# Patient Record
Sex: Female | Born: 1945 | Race: White | Hispanic: No | Marital: Married | Smoking: Former smoker
Health system: Southern US, Community
[De-identification: ages and names within clinical notes are randomized; demographics above are authoritative.]

## PROBLEM LIST (undated history)

## (undated) DIAGNOSIS — J449 Chronic obstructive pulmonary disease, unspecified: Secondary | ICD-10-CM

## (undated) DIAGNOSIS — R0602 Shortness of breath: Secondary | ICD-10-CM

## (undated) DIAGNOSIS — I2510 Atherosclerotic heart disease of native coronary artery without angina pectoris: Secondary | ICD-10-CM

## (undated) DIAGNOSIS — J189 Pneumonia, unspecified organism: Secondary | ICD-10-CM

## (undated) DIAGNOSIS — Z87442 Personal history of urinary calculi: Secondary | ICD-10-CM

## (undated) DIAGNOSIS — I499 Cardiac arrhythmia, unspecified: Secondary | ICD-10-CM

## (undated) DIAGNOSIS — C801 Malignant (primary) neoplasm, unspecified: Secondary | ICD-10-CM

## (undated) DIAGNOSIS — Z9289 Personal history of other medical treatment: Secondary | ICD-10-CM

## (undated) HISTORY — PX: LUNG SURGERY: SHX703

## (undated) HISTORY — PX: ABDOMINAL HYSTERECTOMY: SHX81

## (undated) HISTORY — PX: KNEE ARTHROSCOPY: SHX127

## (undated) HISTORY — PX: CERVICAL SPINE SURGERY: SHX589

## (undated) HISTORY — PX: SPLENECTOMY, PARTIAL: SHX787

## (undated) HISTORY — PX: CARPAL TUNNEL RELEASE: SHX101

## (undated) HISTORY — PX: TEAR DUCT PROBING: SHX793

## (undated) HISTORY — PX: CARDIAC CATHETERIZATION: SHX172

## (undated) HISTORY — PX: ROTATOR CUFF REPAIR: SHX139

## (undated) HISTORY — PX: MOUTH SURGERY: SHX715

## (undated) HISTORY — PX: JOINT REPLACEMENT: SHX530

## (undated) HISTORY — PX: BLADDER SUSPENSION: SHX72

## (undated) HISTORY — PX: MASTECTOMY: SHX3

---

## 2003-10-29 HISTORY — PX: BREAST RECONSTRUCTION: SHX9

## 2004-05-31 ENCOUNTER — Other Ambulatory Visit: Payer: Self-pay

## 2004-08-13 ENCOUNTER — Ambulatory Visit: Payer: Self-pay | Admitting: Internal Medicine

## 2004-08-28 ENCOUNTER — Ambulatory Visit: Payer: Self-pay | Admitting: Internal Medicine

## 2004-09-27 ENCOUNTER — Ambulatory Visit: Payer: Self-pay | Admitting: Internal Medicine

## 2004-11-05 ENCOUNTER — Ambulatory Visit: Payer: Self-pay | Admitting: Internal Medicine

## 2004-11-28 ENCOUNTER — Ambulatory Visit: Payer: Self-pay | Admitting: Internal Medicine

## 2006-10-28 HISTORY — PX: BREAST SURGERY: SHX581

## 2007-12-04 ENCOUNTER — Other Ambulatory Visit: Payer: Self-pay

## 2007-12-04 ENCOUNTER — Inpatient Hospital Stay: Payer: Self-pay | Admitting: Internal Medicine

## 2007-12-27 ENCOUNTER — Ambulatory Visit: Payer: Self-pay | Admitting: Internal Medicine

## 2007-12-31 ENCOUNTER — Ambulatory Visit: Payer: Self-pay | Admitting: Rheumatology

## 2008-01-27 ENCOUNTER — Ambulatory Visit: Payer: Self-pay | Admitting: Internal Medicine

## 2008-02-02 ENCOUNTER — Ambulatory Visit: Payer: Self-pay | Admitting: Specialist

## 2008-02-09 ENCOUNTER — Other Ambulatory Visit: Payer: Self-pay

## 2008-02-09 ENCOUNTER — Inpatient Hospital Stay: Payer: Self-pay | Admitting: Internal Medicine

## 2008-02-10 ENCOUNTER — Ambulatory Visit: Payer: Self-pay | Admitting: Cardiology

## 2008-02-10 ENCOUNTER — Other Ambulatory Visit: Payer: Self-pay

## 2008-02-22 ENCOUNTER — Ambulatory Visit: Payer: Self-pay | Admitting: Internal Medicine

## 2008-02-26 ENCOUNTER — Ambulatory Visit: Payer: Self-pay | Admitting: Internal Medicine

## 2008-03-18 ENCOUNTER — Ambulatory Visit: Payer: Self-pay | Admitting: General Surgery

## 2008-03-28 ENCOUNTER — Ambulatory Visit: Payer: Self-pay | Admitting: Internal Medicine

## 2008-03-31 ENCOUNTER — Ambulatory Visit: Payer: Self-pay | Admitting: General Surgery

## 2008-04-15 ENCOUNTER — Ambulatory Visit: Payer: Self-pay | Admitting: Rheumatology

## 2008-04-27 ENCOUNTER — Ambulatory Visit: Payer: Self-pay | Admitting: Internal Medicine

## 2008-05-04 ENCOUNTER — Ambulatory Visit: Payer: Self-pay | Admitting: Internal Medicine

## 2008-05-28 ENCOUNTER — Ambulatory Visit: Payer: Self-pay | Admitting: Internal Medicine

## 2008-06-27 ENCOUNTER — Ambulatory Visit: Payer: Self-pay | Admitting: Internal Medicine

## 2008-07-05 ENCOUNTER — Ambulatory Visit: Payer: Self-pay | Admitting: Internal Medicine

## 2008-07-28 ENCOUNTER — Ambulatory Visit: Payer: Self-pay | Admitting: Internal Medicine

## 2008-08-28 ENCOUNTER — Ambulatory Visit: Payer: Self-pay | Admitting: Internal Medicine

## 2008-09-05 ENCOUNTER — Ambulatory Visit: Payer: Self-pay | Admitting: Internal Medicine

## 2008-09-27 ENCOUNTER — Ambulatory Visit: Payer: Self-pay | Admitting: Internal Medicine

## 2008-10-28 ENCOUNTER — Ambulatory Visit: Payer: Self-pay | Admitting: Internal Medicine

## 2008-11-07 ENCOUNTER — Ambulatory Visit: Payer: Self-pay | Admitting: Specialist

## 2008-11-28 ENCOUNTER — Ambulatory Visit: Payer: Self-pay | Admitting: Internal Medicine

## 2008-12-26 ENCOUNTER — Ambulatory Visit: Payer: Self-pay | Admitting: Internal Medicine

## 2009-01-26 ENCOUNTER — Ambulatory Visit: Payer: Self-pay | Admitting: Internal Medicine

## 2009-02-25 ENCOUNTER — Ambulatory Visit: Payer: Self-pay | Admitting: Internal Medicine

## 2009-03-13 ENCOUNTER — Ambulatory Visit: Payer: Self-pay | Admitting: Internal Medicine

## 2009-03-28 ENCOUNTER — Ambulatory Visit: Payer: Self-pay | Admitting: Internal Medicine

## 2009-04-27 ENCOUNTER — Ambulatory Visit: Payer: Self-pay | Admitting: Internal Medicine

## 2009-05-15 ENCOUNTER — Ambulatory Visit: Payer: Self-pay | Admitting: Internal Medicine

## 2009-05-28 ENCOUNTER — Ambulatory Visit: Payer: Self-pay | Admitting: Internal Medicine

## 2009-05-30 IMAGING — CT CT CHEST W/ CM
1 series · 16 of 33 positions shown, 20 images · IV contrast (agent unspecified)
Comparison: none

REASON FOR EXAM: chest density   bil opacity chest xray
COMMENTS:

PROCEDURE:     CT  - CT CHEST WITH CONTRAST  - February 02, 2008  [DATE]
RESULT:
HISTORY: Shortness of breath and pneumonia.
COMPARISON STUDIES:   Prior chest x-ray of 12/06/07.

[Series 2: soft tissue · axial · 0.75mm/px · z∈[+180,+465]mm · 16 of 63 slices shown, 20 images]
[im 3/63  mediastinal]
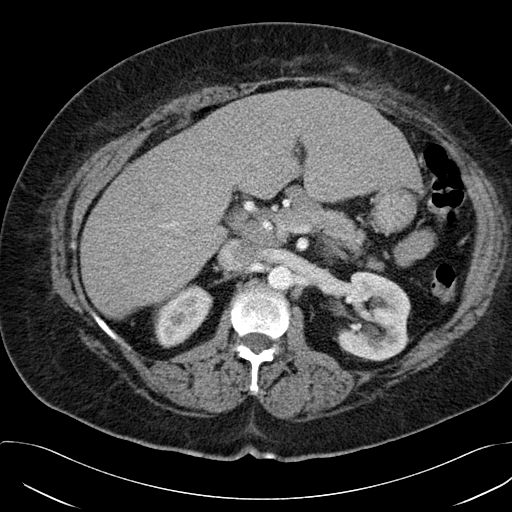
[im 3/63  lung]
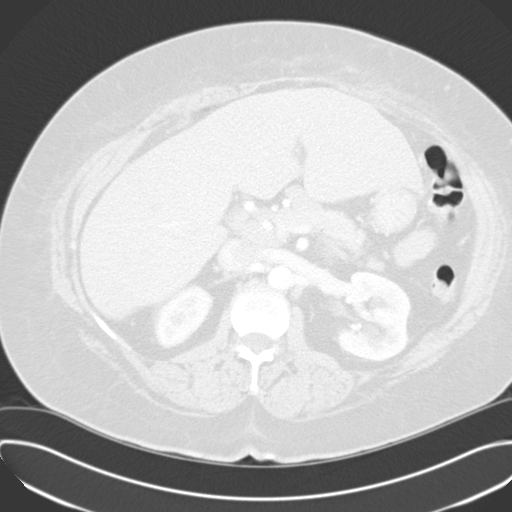
[im 7/63  lung]
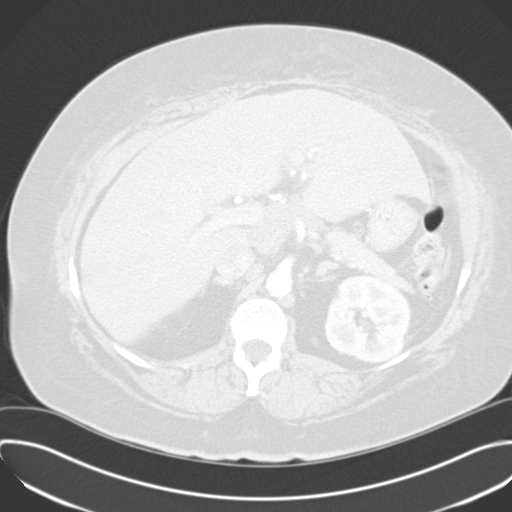
[im 12/63  lung]
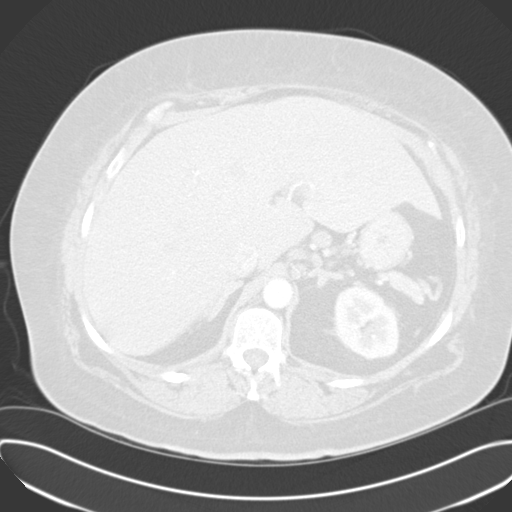
[im 14/63  lung]
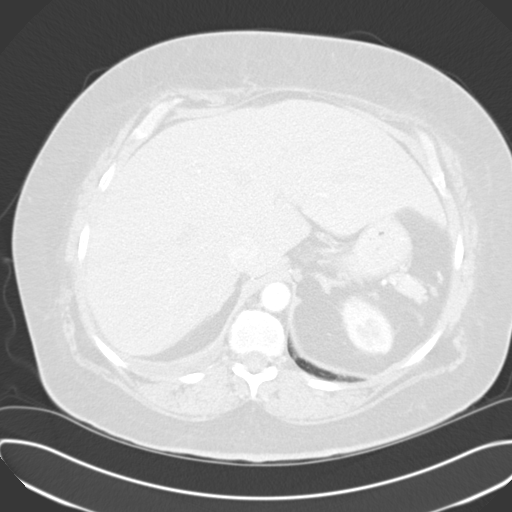
[im 19/63  mediastinal]
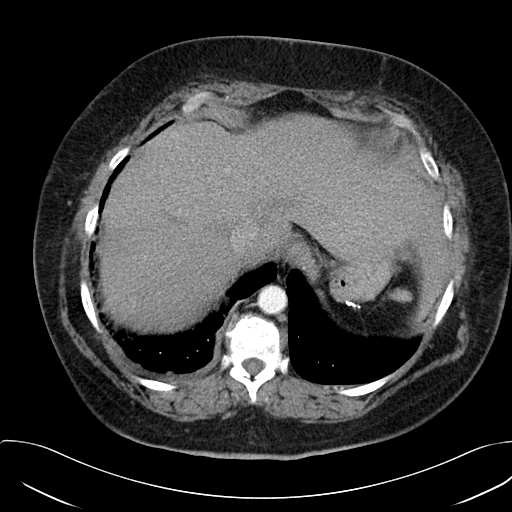
[im 19/63  lung]
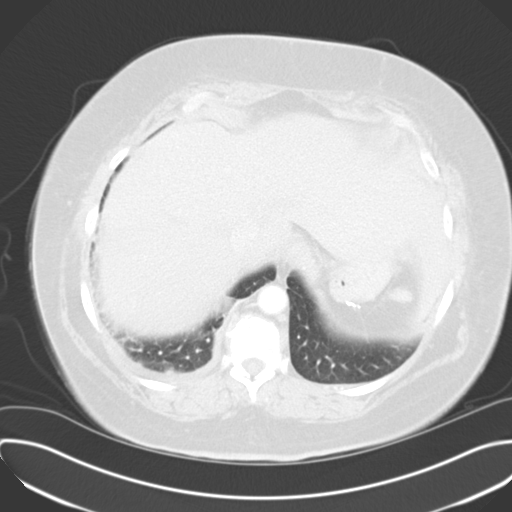
[im 23/63  lung]
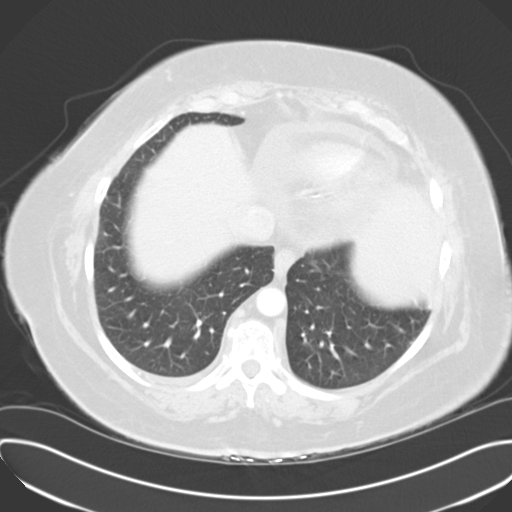
[im 26/63  lung]
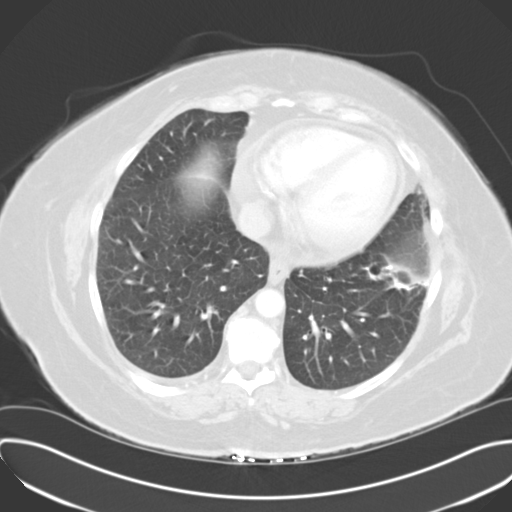
[im 30/63  lung]
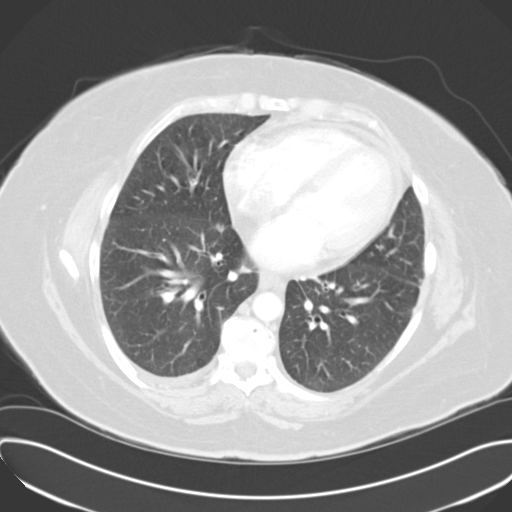
[im 34/63  mediastinal]
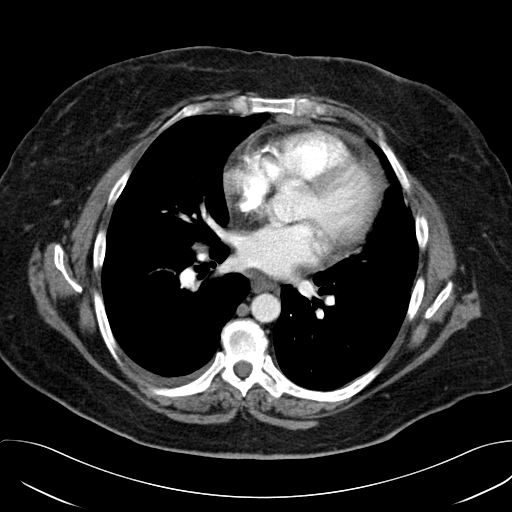
[im 34/63  lung]
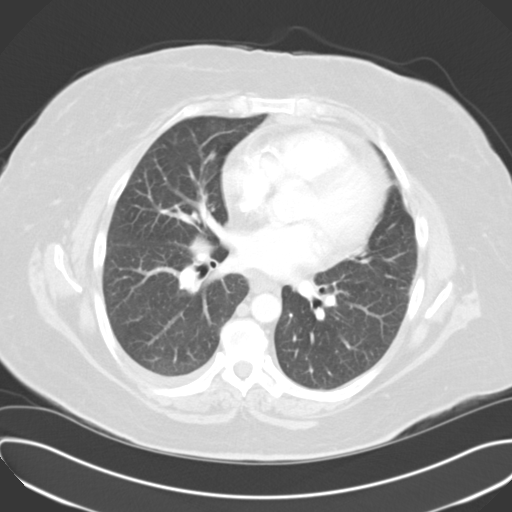
[im 37/63  lung]
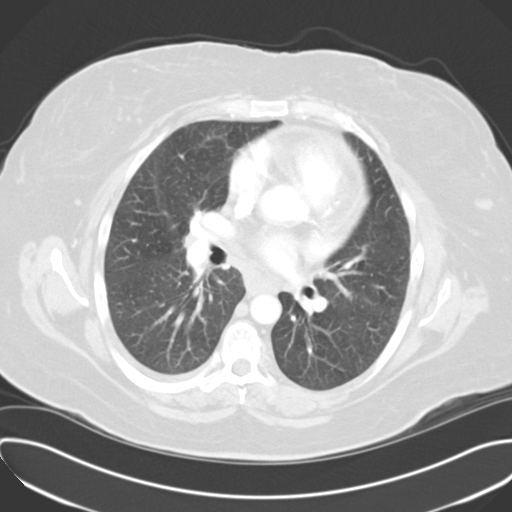
[im 40/63  lung]
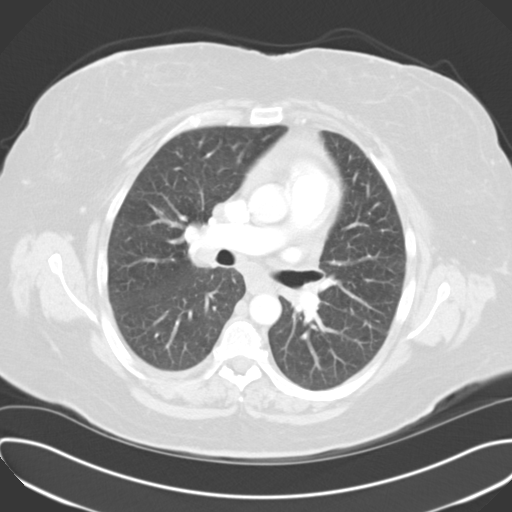
[im 44/63  lung]
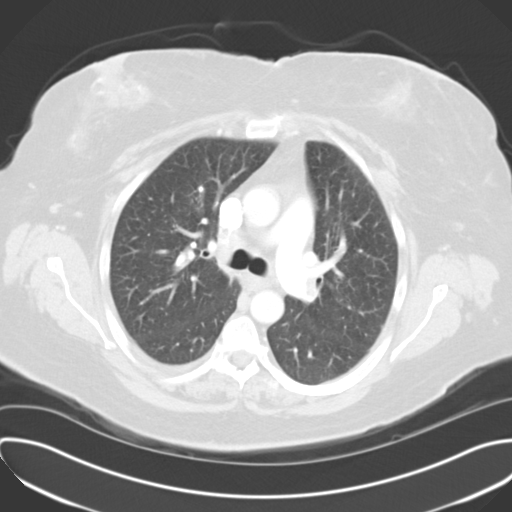
[im 49/63  mediastinal]
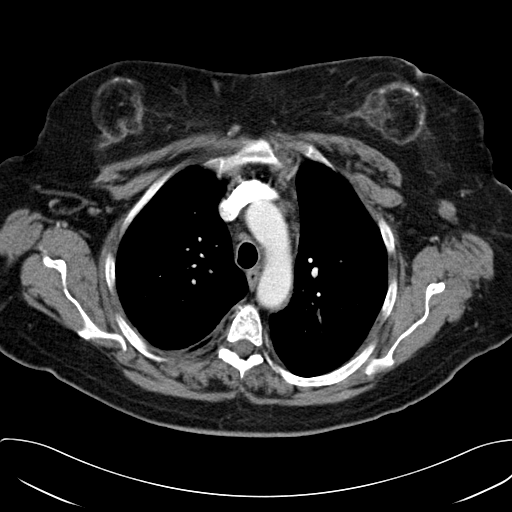
[im 49/63  lung]
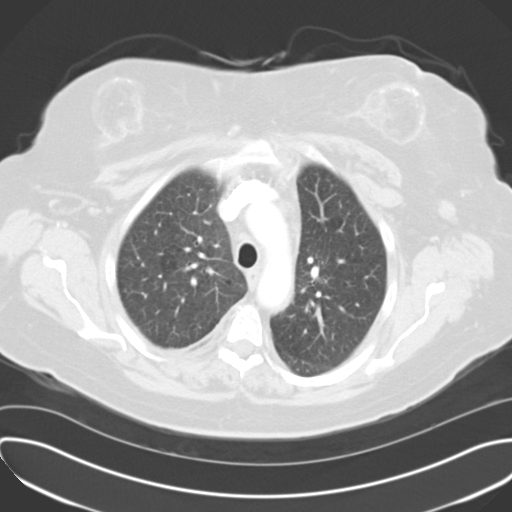
[im 51/63  lung]
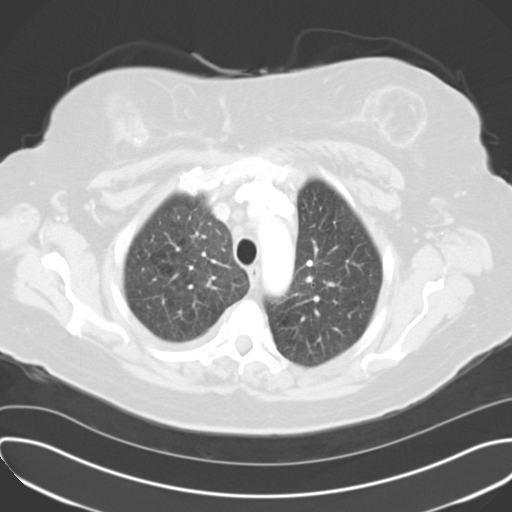
[im 56/63  lung]
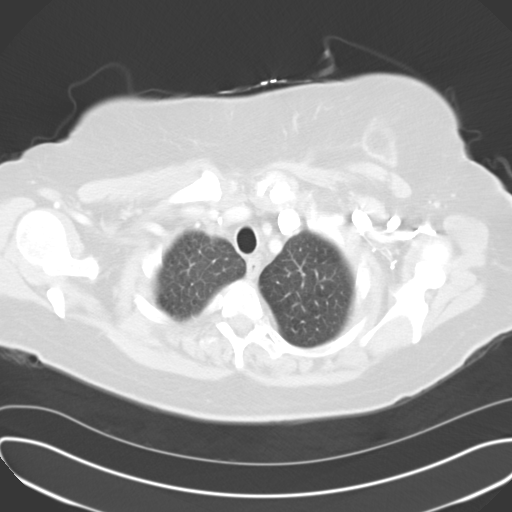
[im 60/63  lung]
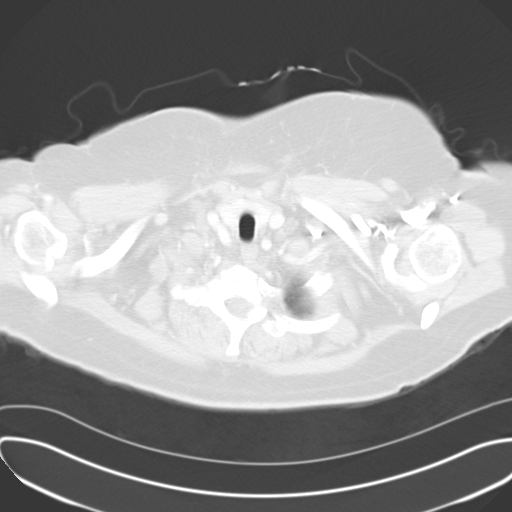

[16 of 33 positions shown; findings below may reference images not displayed]

FINDINGS: IV contrast enhanced chest CT is obtained. Multiple mediastinal,
subcarinal and hilar lymph nodes are present. Largest node is 1.2 to 2.0 cm
in size. There is associated pericardial thickening. Coronary artery disease
is present.  Pulmonary arteries are normal.  Adrenals are normal. Large
airways are patent.  No focal pulmonary infiltrates are noted. Mild basilar
atelectasis is noted.
IMPRESSION: 1.     Diffuse mediastinal and hilar lymphadenopathy with very tiny RIGHT
pleural effusion and basilar atelectasis.  The adenopathy could be benign or
malignant.  A PET CT may prove useful for further evaluation.
2.     Coronary artery disease.

## 2009-06-28 ENCOUNTER — Ambulatory Visit: Payer: Self-pay | Admitting: Internal Medicine

## 2009-07-19 ENCOUNTER — Ambulatory Visit: Payer: Self-pay | Admitting: Orthopedic Surgery

## 2009-07-19 ENCOUNTER — Ambulatory Visit: Payer: Self-pay | Admitting: Internal Medicine

## 2009-07-20 ENCOUNTER — Ambulatory Visit: Payer: Self-pay | Admitting: Orthopedic Surgery

## 2009-07-28 ENCOUNTER — Ambulatory Visit: Payer: Self-pay | Admitting: Internal Medicine

## 2009-08-10 ENCOUNTER — Ambulatory Visit: Payer: Self-pay | Admitting: Internal Medicine

## 2009-08-28 ENCOUNTER — Ambulatory Visit: Payer: Self-pay | Admitting: Internal Medicine

## 2009-10-02 ENCOUNTER — Ambulatory Visit: Payer: Self-pay | Admitting: Internal Medicine

## 2009-10-05 ENCOUNTER — Ambulatory Visit: Payer: Self-pay | Admitting: Internal Medicine

## 2009-10-28 ENCOUNTER — Ambulatory Visit: Payer: Self-pay | Admitting: Internal Medicine

## 2009-11-28 ENCOUNTER — Ambulatory Visit: Payer: Self-pay | Admitting: Internal Medicine

## 2009-12-11 ENCOUNTER — Ambulatory Visit: Payer: Self-pay | Admitting: Unknown Physician Specialty

## 2009-12-14 ENCOUNTER — Ambulatory Visit: Payer: Self-pay | Admitting: Internal Medicine

## 2009-12-18 ENCOUNTER — Ambulatory Visit: Payer: Self-pay | Admitting: Unknown Physician Specialty

## 2009-12-26 ENCOUNTER — Ambulatory Visit: Payer: Self-pay | Admitting: Internal Medicine

## 2010-01-03 ENCOUNTER — Ambulatory Visit: Payer: Self-pay | Admitting: Internal Medicine

## 2010-01-26 ENCOUNTER — Ambulatory Visit: Payer: Self-pay | Admitting: Internal Medicine

## 2010-02-25 ENCOUNTER — Ambulatory Visit: Payer: Self-pay | Admitting: Internal Medicine

## 2010-03-28 ENCOUNTER — Ambulatory Visit: Payer: Self-pay | Admitting: Internal Medicine

## 2010-04-27 ENCOUNTER — Ambulatory Visit: Payer: Self-pay | Admitting: Internal Medicine

## 2010-05-28 ENCOUNTER — Ambulatory Visit: Payer: Self-pay | Admitting: Internal Medicine

## 2010-06-28 ENCOUNTER — Ambulatory Visit: Payer: Self-pay | Admitting: Internal Medicine

## 2010-07-28 ENCOUNTER — Ambulatory Visit: Payer: Self-pay | Admitting: Internal Medicine

## 2010-08-09 ENCOUNTER — Ambulatory Visit: Payer: Self-pay | Admitting: Internal Medicine

## 2010-08-15 ENCOUNTER — Ambulatory Visit: Payer: Self-pay | Admitting: Orthopedic Surgery

## 2010-08-28 ENCOUNTER — Ambulatory Visit: Payer: Self-pay | Admitting: Internal Medicine

## 2010-09-05 ENCOUNTER — Ambulatory Visit: Payer: Self-pay | Admitting: Orthopedic Surgery

## 2010-09-07 ENCOUNTER — Ambulatory Visit: Payer: Self-pay | Admitting: Unknown Physician Specialty

## 2010-09-10 ENCOUNTER — Ambulatory Visit: Payer: Self-pay | Admitting: Orthopedic Surgery

## 2010-09-27 ENCOUNTER — Ambulatory Visit: Payer: Self-pay | Admitting: Internal Medicine

## 2010-10-15 ENCOUNTER — Ambulatory Visit: Payer: Self-pay | Admitting: Unknown Physician Specialty

## 2011-04-02 ENCOUNTER — Ambulatory Visit: Payer: Self-pay

## 2011-04-08 ENCOUNTER — Ambulatory Visit: Payer: Self-pay | Admitting: Internal Medicine

## 2011-04-15 ENCOUNTER — Ambulatory Visit: Payer: Self-pay | Admitting: Unknown Physician Specialty

## 2011-04-17 LAB — PATHOLOGY REPORT

## 2011-04-23 ENCOUNTER — Ambulatory Visit: Payer: Self-pay | Admitting: Orthopedic Surgery

## 2011-04-28 ENCOUNTER — Ambulatory Visit: Payer: Self-pay | Admitting: Internal Medicine

## 2011-04-30 ENCOUNTER — Ambulatory Visit: Payer: Self-pay | Admitting: Orthopedic Surgery

## 2011-07-10 ENCOUNTER — Ambulatory Visit: Payer: Self-pay | Admitting: Internal Medicine

## 2011-07-17 ENCOUNTER — Ambulatory Visit: Payer: Self-pay | Admitting: Orthopedic Surgery

## 2011-07-29 ENCOUNTER — Ambulatory Visit: Payer: Self-pay | Admitting: Internal Medicine

## 2011-08-29 ENCOUNTER — Ambulatory Visit: Payer: Self-pay | Admitting: Internal Medicine

## 2011-09-12 ENCOUNTER — Ambulatory Visit: Payer: Self-pay | Admitting: Otolaryngology

## 2011-10-04 HISTORY — PX: SHOULDER SURGERY: SHX246

## 2011-11-19 ENCOUNTER — Ambulatory Visit: Payer: Self-pay | Admitting: Internal Medicine

## 2012-03-06 ENCOUNTER — Ambulatory Visit: Payer: Self-pay | Admitting: Specialist

## 2012-03-06 LAB — CREATININE, SERUM
Creatinine: 0.69 mg/dL (ref 0.60–1.30)
EGFR (African American): >60
EGFR (Non-African Amer.): >60

## 2012-03-16 ENCOUNTER — Ambulatory Visit: Payer: Self-pay | Admitting: Internal Medicine

## 2012-03-16 LAB — CBC CANCER CENTER
Basophil #: 0.2 x10 3/mm — ABNORMAL HIGH (ref 0.0–0.1)
Basophil %: 2.3 %
Eosinophil %: 7.2 %
HCT: 40 % (ref 35.0–47.0)
MCHC: 30.2 g/dL — ABNORMAL LOW (ref 32.0–36.0)
Monocyte #: 0.8 x10 3/mm (ref 0.2–0.9)
Neutrophil #: 4.6 x10 3/mm (ref 1.4–6.5)
RDW: 17.5 % — ABNORMAL HIGH (ref 11.5–14.5)

## 2012-03-16 LAB — COMPREHENSIVE METABOLIC PANEL
BUN: 12 mg/dL (ref 7–18)
EGFR (Non-African Amer.): >60
SGOT(AST): 30 U/L (ref 15–37)

## 2012-03-16 LAB — PROTIME-INR: Prothrombin Time: 13.1 secs (ref 11.5–14.7)

## 2012-03-28 ENCOUNTER — Ambulatory Visit: Payer: Self-pay | Admitting: Internal Medicine

## 2012-05-04 ENCOUNTER — Ambulatory Visit: Payer: Self-pay | Admitting: Internal Medicine

## 2012-05-05 ENCOUNTER — Ambulatory Visit: Payer: Self-pay | Admitting: Internal Medicine

## 2012-05-28 ENCOUNTER — Ambulatory Visit: Payer: Self-pay | Admitting: Internal Medicine

## 2012-06-17 ENCOUNTER — Ambulatory Visit: Payer: Self-pay | Admitting: Internal Medicine

## 2012-06-17 LAB — CBC CANCER CENTER
Basophil %: 3.2 %
Eosinophil #: 0.7 x10 3/mm (ref 0.0–0.7)
Eosinophil %: 9.2 %
HCT: 45.7 % (ref 35.0–47.0)
HGB: 14.1 g/dL (ref 12.0–16.0)
Lymphocyte %: 30.3 %
MCH: 24.1 pg — ABNORMAL LOW (ref 26.0–34.0)
MCHC: 30.9 g/dL — ABNORMAL LOW (ref 32.0–36.0)
Neutrophil #: 3.8 x10 3/mm (ref 1.4–6.5)

## 2012-06-25 ENCOUNTER — Ambulatory Visit: Payer: Self-pay | Admitting: Specialist

## 2012-06-25 LAB — CREATININE, SERUM
Creatinine: 0.59 mg/dL — ABNORMAL LOW (ref 0.60–1.30)
EGFR (African American): >60
EGFR (Non-African Amer.): >60

## 2012-06-28 ENCOUNTER — Ambulatory Visit: Payer: Self-pay | Admitting: Internal Medicine

## 2012-07-17 LAB — CBC CANCER CENTER
Basophil %: 1.8 %
Eosinophil %: 6.1 %
HCT: 41.8 % (ref 35.0–47.0)
HGB: 13 g/dL (ref 12.0–16.0)
Lymphocyte #: 2.1 x10 3/mm (ref 1.0–3.6)
MCH: 25 pg — ABNORMAL LOW (ref 26.0–34.0)
MCV: 80 fL (ref 80–100)
Monocyte #: 0.8 x10 3/mm (ref 0.2–0.9)
Neutrophil #: 4.7 x10 3/mm (ref 1.4–6.5)
RBC: 5.2 10*6/uL (ref 3.80–5.20)
WBC: 8.3 x10 3/mm (ref 3.6–11.0)

## 2012-07-21 LAB — CBC WITH DIFFERENTIAL/PLATELET
Basophil #: 0.4 10*3/uL — ABNORMAL HIGH (ref 0.0–0.1)
Eosinophil #: 0.2 10*3/uL (ref 0.0–0.7)
Eosinophil %: 1.7 %
HCT: 43.6 % (ref 35.0–47.0)
HGB: 13.6 g/dL (ref 12.0–16.0)
MCV: 80 fL (ref 80–100)
Monocyte #: 1.4 x10 3/mm — ABNORMAL HIGH (ref 0.2–0.9)
Neutrophil %: 66.2 %
Platelet: 245 10*3/uL (ref 150–440)
RDW: 19.3 % — ABNORMAL HIGH (ref 11.5–14.5)
WBC: 13.3 10*3/uL — ABNORMAL HIGH (ref 3.6–11.0)

## 2012-07-28 ENCOUNTER — Ambulatory Visit: Payer: Self-pay

## 2012-07-28 ENCOUNTER — Ambulatory Visit: Payer: Self-pay | Admitting: Internal Medicine

## 2012-09-25 ENCOUNTER — Emergency Department: Payer: Self-pay | Admitting: Emergency Medicine

## 2012-09-30 ENCOUNTER — Ambulatory Visit: Payer: Self-pay | Admitting: Internal Medicine

## 2012-11-19 ENCOUNTER — Ambulatory Visit: Payer: Self-pay | Admitting: Internal Medicine

## 2013-02-26 ENCOUNTER — Ambulatory Visit: Payer: Self-pay | Admitting: Neurological Surgery

## 2013-03-04 ENCOUNTER — Ambulatory Visit: Payer: Self-pay | Admitting: Neurological Surgery

## 2013-03-31 ENCOUNTER — Other Ambulatory Visit: Payer: Self-pay | Admitting: Neurological Surgery

## 2013-04-01 ENCOUNTER — Encounter (HOSPITAL_COMMUNITY): Payer: Self-pay | Admitting: Pharmacy Technician

## 2013-04-09 ENCOUNTER — Encounter (HOSPITAL_COMMUNITY)
Admission: RE | Admit: 2013-04-09 | Discharge: 2013-04-09 | Disposition: A | Discharge status: Home / Self Care | Payer: Medicare Other | Source: Ambulatory Visit | Attending: Neurological Surgery | Admitting: Neurological Surgery

## 2013-04-09 ENCOUNTER — Encounter (HOSPITAL_COMMUNITY): Payer: Self-pay

## 2013-04-09 HISTORY — DX: Personal history of urinary calculi: Z87.442

## 2013-04-09 HISTORY — DX: Malignant (primary) neoplasm, unspecified: C80.1

## 2013-04-09 HISTORY — DX: Cardiac arrhythmia, unspecified: I49.9

## 2013-04-09 HISTORY — DX: Atherosclerotic heart disease of native coronary artery without angina pectoris: I25.10

## 2013-04-09 HISTORY — DX: Pneumonia, unspecified organism: J18.9

## 2013-04-09 HISTORY — DX: Shortness of breath: R06.02

## 2013-04-09 HISTORY — DX: Chronic obstructive pulmonary disease, unspecified: J44.9

## 2013-04-09 HISTORY — DX: Personal history of other medical treatment: Z92.89

## 2013-04-09 LAB — CBC WITH DIFFERENTIAL/PLATELET
Eosinophils Absolute: 1.2 10*3/uL — ABNORMAL HIGH (ref 0.0–0.7)
Hemoglobin: 14.8 g/dL (ref 12.0–15.0)
Lymphocytes Relative: 25 % (ref 12–46)
Lymphs Abs: 2.2 10*3/uL (ref 0.7–4.0)
MCH: 27.3 pg (ref 26.0–34.0)
Monocytes Relative: 10 % (ref 3–12)
Neutro Abs: 4.1 10*3/uL (ref 1.7–7.7)
Neutrophils Relative %: 48 % (ref 43–77)
Platelets: 118 10*3/uL — ABNORMAL LOW (ref 150–400)
RBC: 5.43 MIL/uL — ABNORMAL HIGH (ref 3.87–5.11)
WBC: 8.5 10*3/uL (ref 4.0–10.5)

## 2013-04-09 LAB — BASIC METABOLIC PANEL
BUN: 14 mg/dL (ref 6–23)
CO2: 26 mEq/L (ref 19–32)
Calcium: 9.7 mg/dL (ref 8.4–10.5)
Chloride: 104 mEq/L (ref 96–112)
Creatinine, Ser: 0.59 mg/dL (ref 0.50–1.10)
GFR calc Af Amer: >90 mL/min (ref >90)
GFR calc non Af Amer: >90 mL/min (ref >90)
Glucose, Bld: 80 mg/dL (ref 70–99)
Potassium: 4.7 mEq/L (ref 3.5–5.1)
Sodium: 139 mEq/L (ref 135–145)

## 2013-04-09 LAB — PROTIME-INR
INR: 0.98 (ref 0.00–1.49)
Prothrombin Time: 12.9 seconds (ref 11.6–15.2)

## 2013-04-09 LAB — SURGICAL PCR SCREEN
MRSA, PCR: NEGATIVE
Staphylococcus aureus: POSITIVE — AB

## 2013-04-09 NOTE — Pre-Procedure Instructions (Signed)
Felicia Gentry  04/09/2013   Your procedure is scheduled on: Wednesday, June 18th.  Report to [facility] at 6:30 AM.  Call this number if you have problems the morning of surgery: [phone]   Remember:   Do not eat food or drink liquids after midnight.   Take these medicines the morning of surgery with A SIP OF WATER: FLUoxetine (PROZAC),gabapentin (NEURONTIN),letrozole (FEMARA omeprazole (PRILOSEC),hydroxychloroquine (PLAQUENIL May use: albuteFluticasone-Salmeterol (ADVAIR DISKUS) rol and (PROVENTIL HFA;VENTOLIN HFA) and bring Ventolin to the hospital with you.                   Do not wear jewelry, make-up or nail polish.  Do not wear lotions, powders, or perfumes. You may wear deodorant.  Do not shave 48 hours prior to surgery.   Do not bring valuables to the hospital.  [facility] is not responsible  for any belongings or valuables.  Contacts, dentures or bridgework may not be worn into surgery.  Leave suitcase in the car. After surgery it may be brought to your room.  For patients admitted to the hospital, checkout time is 11:00 AM the day of discharge.   Patients discharged the day of surgery will not be allowed to drive home.  Name and phone number of your driver: -   Special Instructions: Shower using CHG 2 nights before surgery and the night before surgery.  If you shower the day of surgery use CHG.  Use special wash - you have one bottle of CHG for all showers.  You should use approximately 1/3 of the bottle for each shower.   Please read over the following fact sheets that you were given: Pain Booklet, Coughing and Deep Breathing and Surgical Site Infection Prevention

## 2013-04-09 NOTE — Progress Notes (Signed)
I faxed request to [facility] cardiology and [facility] requesting EKG, Chest Xray, Stress test and last office notes.

## 2013-04-13 MED ORDER — CEFAZOLIN SODIUM-DEXTROSE 2-3 GM-% IV SOLR
2.0000 g | INTRAVENOUS | Status: DC
  Filled 2013-04-13: qty 50

## 2013-04-14 ENCOUNTER — Inpatient Hospital Stay (HOSPITAL_COMMUNITY): Payer: Medicare Other | Admitting: Anesthesiology

## 2013-04-14 ENCOUNTER — Encounter (HOSPITAL_COMMUNITY): Payer: Self-pay | Admitting: Anesthesiology

## 2013-04-14 ENCOUNTER — Inpatient Hospital Stay (HOSPITAL_COMMUNITY)
Admission: RE | Admit: 2013-04-14 | Discharge: 2013-04-15 | DRG: 473 | Disposition: A | Discharge status: Home / Self Care | Payer: Medicare Other | Source: Ambulatory Visit | Attending: Neurological Surgery | Admitting: Neurological Surgery

## 2013-04-14 ENCOUNTER — Encounter (HOSPITAL_COMMUNITY): Payer: Self-pay | Admitting: *Deleted

## 2013-04-14 ENCOUNTER — Encounter (HOSPITAL_COMMUNITY)
Admission: RE | Disposition: A | Discharge status: Home / Self Care | Payer: Self-pay | Source: Ambulatory Visit | Attending: Neurological Surgery

## 2013-04-14 ENCOUNTER — Inpatient Hospital Stay (HOSPITAL_COMMUNITY): Payer: Medicare Other

## 2013-04-14 DIAGNOSIS — Z853 Personal history of malignant neoplasm of breast: Secondary | ICD-10-CM

## 2013-04-14 DIAGNOSIS — Z87891 Personal history of nicotine dependence: Secondary | ICD-10-CM

## 2013-04-14 DIAGNOSIS — M5020 Other cervical disc displacement, unspecified cervical region: Secondary | ICD-10-CM | POA: Diagnosis present

## 2013-04-14 DIAGNOSIS — J4489 Other specified chronic obstructive pulmonary disease: Secondary | ICD-10-CM | POA: Diagnosis present

## 2013-04-14 DIAGNOSIS — Z79899 Other long term (current) drug therapy: Secondary | ICD-10-CM

## 2013-04-14 DIAGNOSIS — T84498A Other mechanical complication of other internal orthopedic devices, implants and grafts, initial encounter: Principal | ICD-10-CM | POA: Diagnosis present

## 2013-04-14 DIAGNOSIS — Z981 Arthrodesis status: Secondary | ICD-10-CM

## 2013-04-14 DIAGNOSIS — I2510 Atherosclerotic heart disease of native coronary artery without angina pectoris: Secondary | ICD-10-CM | POA: Diagnosis present

## 2013-04-14 DIAGNOSIS — J449 Chronic obstructive pulmonary disease, unspecified: Secondary | ICD-10-CM | POA: Diagnosis present

## 2013-04-14 DIAGNOSIS — Z01812 Encounter for preprocedural laboratory examination: Secondary | ICD-10-CM

## 2013-04-14 DIAGNOSIS — M47812 Spondylosis without myelopathy or radiculopathy, cervical region: Secondary | ICD-10-CM | POA: Diagnosis present

## 2013-04-14 DIAGNOSIS — Y831 Surgical operation with implant of artificial internal device as the cause of abnormal reaction of the patient, or of later complication, without mention of misadventure at the time of the procedure: Secondary | ICD-10-CM | POA: Diagnosis present

## 2013-04-14 HISTORY — PX: ANTERIOR CERVICAL DECOMP/DISCECTOMY FUSION: SHX1161

## 2013-04-14 SURGERY — ANTERIOR CERVICAL DECOMPRESSION/DISCECTOMY FUSION 2 LEVELS
Anesthesia: General | Site: Neck | Wound class: Clean

## 2013-04-14 MED ORDER — ONDANSETRON HCL 4 MG/2ML IJ SOLN: 4.0000 mg | INTRAMUSCULAR | Status: DC | PRN

## 2013-04-14 MED ORDER — OXYCODONE-ACETAMINOPHEN 5-325 MG PO TABS: 1.0000 | ORAL_TABLET | ORAL | Status: DC | PRN

## 2013-04-14 MED ORDER — HYDROMORPHONE HCL PF 1 MG/ML IJ SOLN
0.2500 mg | INTRAMUSCULAR | Status: DC | PRN
  Administered 2013-04-14: 0.5 mg via INTRAVENOUS

## 2013-04-14 MED ORDER — PROPOFOL 10 MG/ML IV BOLUS
INTRAVENOUS | Status: DC | PRN
  Administered 2013-04-14: 150 mg via INTRAVENOUS

## 2013-04-14 MED ORDER — FLUOXETINE HCL 20 MG PO CAPS
40.0000 mg | ORAL_CAPSULE | Freq: Every day | ORAL | Status: DC
  Filled 2013-04-14 (×2): qty 2

## 2013-04-14 MED ORDER — LIDOCAINE HCL (CARDIAC) 20 MG/ML IV SOLN
INTRAVENOUS | Status: DC | PRN
  Administered 2013-04-14: 80 mg via INTRAVENOUS

## 2013-04-14 MED ORDER — ONDANSETRON HCL 4 MG/2ML IJ SOLN
INTRAMUSCULAR | Status: DC | PRN
  Administered 2013-04-14: 4 mg via INTRAVENOUS

## 2013-04-14 MED ORDER — ACETAMINOPHEN 10 MG/ML IV SOLN
1000.0000 mg | Freq: Four times a day (QID) | INTRAVENOUS | Status: AC
  Administered 2013-04-14 – 2013-04-15 (×4): 1000 mg via INTRAVENOUS
  Filled 2013-04-14 (×6): qty 100

## 2013-04-14 MED ORDER — ACETAMINOPHEN 325 MG PO TABS: 650.0000 mg | ORAL_TABLET | ORAL | Status: DC | PRN

## 2013-04-14 MED ORDER — FENTANYL CITRATE 0.05 MG/ML IJ SOLN
INTRAMUSCULAR | Status: DC | PRN
  Administered 2013-04-14: 100 ug via INTRAVENOUS
  Administered 2013-04-14: 50 ug via INTRAVENOUS

## 2013-04-14 MED ORDER — VECURONIUM BROMIDE 10 MG IV SOLR
INTRAVENOUS | Status: DC | PRN
  Administered 2013-04-14 (×2): 1 mg via INTRAVENOUS

## 2013-04-14 MED ORDER — MIDAZOLAM HCL 5 MG/5ML IJ SOLN
INTRAMUSCULAR | Status: DC | PRN
  Administered 2013-04-14: 2 mg via INTRAVENOUS

## 2013-04-14 MED ORDER — PROMETHAZINE HCL 25 MG/ML IJ SOLN: 6.2500 mg | INTRAMUSCULAR | Status: DC | PRN

## 2013-04-14 MED ORDER — ACETAMINOPHEN 650 MG RE SUPP: 650.0000 mg | RECTAL | Status: DC | PRN

## 2013-04-14 MED ORDER — SODIUM CHLORIDE 0.9 % IV SOLN: 250.0000 mL | INTRAVENOUS | Status: DC

## 2013-04-14 MED ORDER — GLYCOPYRROLATE 0.2 MG/ML IJ SOLN
INTRAMUSCULAR | Status: DC | PRN
  Administered 2013-04-14: .6 mg via INTRAVENOUS

## 2013-04-14 MED ORDER — MIDAZOLAM HCL 2 MG/2ML IJ SOLN: 1.0000 mg | INTRAMUSCULAR | Status: DC | PRN

## 2013-04-14 MED ORDER — LETROZOLE 2.5 MG PO TABS: 2.5000 mg | ORAL_TABLET | Freq: Two times a day (BID) | ORAL | Status: DC

## 2013-04-14 MED ORDER — BACITRACIN 50000 UNITS IM SOLR
INTRAMUSCULAR | Status: AC
  Filled 2013-04-14: qty 1

## 2013-04-14 MED ORDER — HEMOSTATIC AGENTS (NO CHARGE) OPTIME
TOPICAL | Status: DC | PRN
  Administered 2013-04-14: 1 via TOPICAL

## 2013-04-14 MED ORDER — NEOSTIGMINE METHYLSULFATE 1 MG/ML IJ SOLN
INTRAMUSCULAR | Status: DC | PRN
  Administered 2013-04-14: 4 mg via INTRAVENOUS

## 2013-04-14 MED ORDER — PHENOL 1.4 % MT LIQD
1.0000 | OROMUCOSAL | Status: DC | PRN
  Administered 2013-04-14: 1 via OROMUCOSAL
  Filled 2013-04-14: qty 177

## 2013-04-14 MED ORDER — GABAPENTIN 300 MG PO CAPS
600.0000 mg | ORAL_CAPSULE | Freq: Three times a day (TID) | ORAL | Status: DC
  Administered 2013-04-14 (×2): 600 mg via ORAL
  Filled 2013-04-14 (×5): qty 2

## 2013-04-14 MED ORDER — SODIUM CHLORIDE 0.9 % IV SOLN
INTRAVENOUS | Status: AC
  Filled 2013-04-14: qty 500

## 2013-04-14 MED ORDER — DEXAMETHASONE SODIUM PHOSPHATE 10 MG/ML IJ SOLN
10.0000 mg | INTRAMUSCULAR | Status: AC
  Administered 2013-04-14: 10 mg via INTRAVENOUS

## 2013-04-14 MED ORDER — 0.9 % SODIUM CHLORIDE (POUR BTL) OPTIME
TOPICAL | Status: DC | PRN
  Administered 2013-04-14: 1000 mL

## 2013-04-14 MED ORDER — MOMETASONE FURO-FORMOTEROL FUM 100-5 MCG/ACT IN AERO
2.0000 | INHALATION_SPRAY | Freq: Two times a day (BID) | RESPIRATORY_TRACT | Status: DC
  Administered 2013-04-14 (×2): 2 via RESPIRATORY_TRACT
  Filled 2013-04-14: qty 8.8

## 2013-04-14 MED ORDER — SODIUM CHLORIDE 0.9 % IJ SOLN
3.0000 mL | Freq: Two times a day (BID) | INTRAMUSCULAR | Status: DC
  Administered 2013-04-14: 3 mL via INTRAVENOUS

## 2013-04-14 MED ORDER — SODIUM CHLORIDE 0.9 % IR SOLN
Status: DC | PRN
  Administered 2013-04-14: 09:00:00

## 2013-04-14 MED ORDER — PHENYLEPHRINE HCL 10 MG/ML IJ SOLN
INTRAMUSCULAR | Status: DC | PRN
  Administered 2013-04-14 (×7): 80 ug via INTRAVENOUS

## 2013-04-14 MED ORDER — MENTHOL 3 MG MT LOZG
1.0000 | LOZENGE | OROMUCOSAL | Status: DC | PRN
  Filled 2013-04-14: qty 9

## 2013-04-14 MED ORDER — METOCLOPRAMIDE HCL 10 MG PO TABS
10.0000 mg | ORAL_TABLET | Freq: Every day | ORAL | Status: DC
  Administered 2013-04-14: 10 mg via ORAL
  Filled 2013-04-14 (×2): qty 1

## 2013-04-14 MED ORDER — FENTANYL CITRATE 0.05 MG/ML IJ SOLN: 50.0000 ug | Freq: Once | INTRAMUSCULAR | Status: DC

## 2013-04-14 MED ORDER — THROMBIN 5000 UNITS EX SOLR
OROMUCOSAL | Status: DC | PRN
  Administered 2013-04-14: 09:00:00 via TOPICAL

## 2013-04-14 MED ORDER — BUPIVACAINE HCL (PF) 0.25 % IJ SOLN
INTRAMUSCULAR | Status: DC | PRN
  Administered 2013-04-14: 5 mL

## 2013-04-14 MED ORDER — PANTOPRAZOLE SODIUM 40 MG PO TBEC: 40.0000 mg | DELAYED_RELEASE_TABLET | Freq: Every day | ORAL | Status: DC

## 2013-04-14 MED ORDER — LIDOCAINE HCL 4 % MT SOLN
OROMUCOSAL | Status: DC | PRN
  Administered 2013-04-14: 4 mL via TOPICAL

## 2013-04-14 MED ORDER — SODIUM CHLORIDE 0.9 % IJ SOLN: 3.0000 mL | INTRAMUSCULAR | Status: DC | PRN

## 2013-04-14 MED ORDER — VANCOMYCIN HCL IN DEXTROSE 1-5 GM/200ML-% IV SOLN
INTRAVENOUS | Status: AC
  Administered 2013-04-14: 1000 mg via INTRAVENOUS
  Filled 2013-04-14: qty 200

## 2013-04-14 MED ORDER — FLUOXETINE HCL 40 MG PO CAPS: 40.0000 mg | ORAL_CAPSULE | Freq: Every day | ORAL | Status: DC

## 2013-04-14 MED ORDER — HYDROMORPHONE HCL PF 1 MG/ML IJ SOLN: 0.5000 mg | INTRAMUSCULAR | Status: DC | PRN

## 2013-04-14 MED ORDER — LACTATED RINGERS IV SOLN
INTRAVENOUS | Status: DC | PRN
  Administered 2013-04-14 (×2): via INTRAVENOUS

## 2013-04-14 MED ORDER — FUROSEMIDE 20 MG PO TABS
20.0000 mg | ORAL_TABLET | Freq: Two times a day (BID) | ORAL | Status: DC
  Filled 2013-04-14 (×4): qty 1

## 2013-04-14 MED ORDER — POTASSIUM CHLORIDE IN NACL 20-0.9 MEQ/L-% IV SOLN
INTRAVENOUS | Status: DC
  Filled 2013-04-14 (×3): qty 1000

## 2013-04-14 MED ORDER — MUPIROCIN 2 % EX OINT
1.0000 "application " | TOPICAL_OINTMENT | Freq: Two times a day (BID) | CUTANEOUS | Status: DC
  Administered 2013-04-14 (×2): 1 via NASAL
  Filled 2013-04-14: qty 22

## 2013-04-14 MED ORDER — ROCURONIUM BROMIDE 100 MG/10ML IV SOLN
INTRAVENOUS | Status: DC | PRN
  Administered 2013-04-14: 50 mg via INTRAVENOUS

## 2013-04-14 MED ORDER — DEXAMETHASONE SODIUM PHOSPHATE 10 MG/ML IJ SOLN
INTRAMUSCULAR | Status: AC
  Filled 2013-04-14: qty 1

## 2013-04-14 MED ORDER — LETROZOLE 2.5 MG PO TABS
5.0000 mg | ORAL_TABLET | Freq: Every day | ORAL | Status: DC
  Filled 2013-04-14: qty 2

## 2013-04-14 MED ORDER — ZOLPIDEM TARTRATE 5 MG PO TABS
5.0000 mg | ORAL_TABLET | Freq: Every evening | ORAL | Status: DC | PRN
  Administered 2013-04-14: 5 mg via ORAL
  Filled 2013-04-14: qty 1

## 2013-04-14 MED ORDER — ALBUTEROL SULFATE HFA 108 (90 BASE) MCG/ACT IN AERS: 2.0000 | INHALATION_SPRAY | RESPIRATORY_TRACT | Status: DC | PRN

## 2013-04-14 MED ORDER — THROMBIN 5000 UNITS EX SOLR
CUTANEOUS | Status: DC | PRN
  Administered 2013-04-14 (×2): 5000 [IU] via TOPICAL

## 2013-04-14 MED ORDER — HYDROXYCHLOROQUINE SULFATE 200 MG PO TABS
200.0000 mg | ORAL_TABLET | Freq: Two times a day (BID) | ORAL | Status: DC
  Administered 2013-04-14: 200 mg via ORAL
  Filled 2013-04-14 (×3): qty 1

## 2013-04-14 MED ORDER — HYDROMORPHONE HCL PF 1 MG/ML IJ SOLN
INTRAMUSCULAR | Status: AC
  Filled 2013-04-14: qty 1

## 2013-04-14 MED ORDER — NIFEDIPINE ER 30 MG PO TB24
30.0000 mg | ORAL_TABLET | Freq: Every day | ORAL | Status: DC
  Administered 2013-04-14: 30 mg via ORAL
  Filled 2013-04-14 (×2): qty 1

## 2013-04-14 MED ORDER — CYCLOBENZAPRINE HCL 5 MG PO TABS
5.0000 mg | ORAL_TABLET | Freq: Every day | ORAL | Status: DC
  Administered 2013-04-14: 5 mg via ORAL
  Filled 2013-04-14 (×2): qty 1

## 2013-04-14 SURGICAL SUPPLY — 48 items
ALLOGRAFT LORDOTIC CC 7X11X14 (Bone Implant) ×2 IMPLANT
ALLOGRAFT TRIAD LORDOTIC CC (Bone Implant) ×2 IMPLANT
BAG DECANTER FOR FLEXI CONT (MISCELLANEOUS) ×2 IMPLANT
BENZOIN TINCTURE PRP APPL 2/3 (GAUZE/BANDAGES/DRESSINGS) ×2 IMPLANT
BUR MATCHSTICK NEURO 3.0 LAGG (BURR) ×2 IMPLANT
CANISTER SUCTION 2500CC (MISCELLANEOUS) ×2 IMPLANT
CLOTH BEACON ORANGE TIMEOUT ST (SAFETY) ×2 IMPLANT
CONT SPEC 4OZ CLIKSEAL STRL BL (MISCELLANEOUS) ×2 IMPLANT
DRAPE C-ARM 42X72 X-RAY (DRAPES) ×6 IMPLANT
DRAPE LAPAROTOMY 100X72 PEDS (DRAPES) ×2 IMPLANT
DRAPE MICROSCOPE ZEISS OPMI (DRAPES) ×2 IMPLANT
DRAPE POUCH INSTRU U-SHP 10X18 (DRAPES) ×2 IMPLANT
DRESSING TELFA 8X3 (GAUZE/BANDAGES/DRESSINGS) ×2 IMPLANT
DRSG OPSITE 4X5.5 SM (GAUZE/BANDAGES/DRESSINGS) ×2 IMPLANT
DURAPREP 6ML APPLICATOR 50/CS (WOUND CARE) ×2 IMPLANT
ELECT COATED BLADE 2.86 ST (ELECTRODE) ×2 IMPLANT
ELECT REM PT RETURN 9FT ADLT (ELECTROSURGICAL) ×2
ELECTRODE REM PT RTRN 9FT ADLT (ELECTROSURGICAL) ×1 IMPLANT
GAUZE SPONGE 4X4 16PLY XRAY LF (GAUZE/BANDAGES/DRESSINGS) IMPLANT
GLOVE BIOGEL M 8.0 STRL (GLOVE) ×2 IMPLANT
GLOVE ECLIPSE 6.5 STRL STRAW (GLOVE) ×2 IMPLANT
GOWN BRE IMP SLV AUR LG STRL (GOWN DISPOSABLE) ×4 IMPLANT
GOWN BRE IMP SLV AUR XL STRL (GOWN DISPOSABLE) ×2 IMPLANT
GOWN STRL REIN 2XL LVL4 (GOWN DISPOSABLE) IMPLANT
HEMOSTAT POWDER KIT SURGIFOAM (HEMOSTASIS) ×2 IMPLANT
KIT BASIN OR (CUSTOM PROCEDURE TRAY) ×2 IMPLANT
KIT ROOM TURNOVER OR (KITS) ×2 IMPLANT
NEEDLE HYPO 25X1 1.5 SAFETY (NEEDLE) ×2 IMPLANT
NEEDLE SPNL 20GX3.5 QUINCKE YW (NEEDLE) ×2 IMPLANT
NS IRRIG 1000ML POUR BTL (IV SOLUTION) ×2 IMPLANT
PACK LAMINECTOMY NEURO (CUSTOM PROCEDURE TRAY) ×2 IMPLANT
PAD ARMBOARD 7.5X6 YLW CONV (MISCELLANEOUS) ×6 IMPLANT
PLATE HELIX R 22MM (Plate) ×4 IMPLANT
RUBBERBAND STERILE (MISCELLANEOUS) ×4 IMPLANT
SCREW 4.0X13 (Screw) ×8 IMPLANT
SCREW 4.0X13MM (Screw) ×8 IMPLANT
SPONGE GAUZE 4X4 12PLY (GAUZE/BANDAGES/DRESSINGS) ×2 IMPLANT
SPONGE INTESTINAL PEANUT (DISPOSABLE) ×2 IMPLANT
SPONGE SURGIFOAM ABS GEL SZ50 (HEMOSTASIS) ×2 IMPLANT
STRIP CLOSURE SKIN 1/2X4 (GAUZE/BANDAGES/DRESSINGS) ×2 IMPLANT
SUT VIC AB 3-0 SH 8-18 (SUTURE) ×4 IMPLANT
SYR 20ML ECCENTRIC (SYRINGE) ×2 IMPLANT
TAPE CLOTH SURG 4X10 WHT LF (GAUZE/BANDAGES/DRESSINGS) ×2 IMPLANT
TAPE STRIPS DRAPE STRL (GAUZE/BANDAGES/DRESSINGS) ×2 IMPLANT
TOWEL OR 17X24 6PK STRL BLUE (TOWEL DISPOSABLE) ×2 IMPLANT
TOWEL OR 17X26 10 PK STRL BLUE (TOWEL DISPOSABLE) ×2 IMPLANT
TRAP SPECIMEN MUCOUS 40CC (MISCELLANEOUS) IMPLANT
WATER STERILE IRR 1000ML POUR (IV SOLUTION) ×2 IMPLANT

## 2013-04-14 NOTE — Transfer of Care (Signed)
Immediate Anesthesia Transfer of Care Note  Patient: Felicia Gentry  Procedure(s) Performed: Procedure(s) with comments: ANTERIOR CERVICAL DECOMPRESSION/DISCECTOMY FUSION 2 LEVELS (N/A) - ANTERIOR CERVICAL DECOMPRESSION/DISCECTOMY FUSION CERVICAL FOUR-FIVE CERVICAL SIX-SEVEN  Patient Location: PACU  Anesthesia Type:General  Level of Consciousness: awake, alert  and oriented  Airway & Oxygen Therapy: Patient Spontanous Breathing and Patient connected to nasal cannula oxygen  Post-op Assessment: Report given to PACU RN, Post -op Vital signs reviewed and stable and Patient moving all extremities X 4  Post vital signs: Reviewed and stable  Complications: No apparent anesthesia complications

## 2013-04-14 NOTE — Anesthesia Postprocedure Evaluation (Signed)
  Anesthesia Post-op Note  Patient: Felicia Gentry  Procedure(s) Performed: Procedure(s) with comments: ANTERIOR CERVICAL DECOMPRESSION/DISCECTOMY FUSION 2 LEVELS (N/A) - ANTERIOR CERVICAL DECOMPRESSION/DISCECTOMY FUSION CERVICAL FOUR-FIVE CERVICAL SIX-SEVEN  Patient Location: PACU  Anesthesia Type:General  Level of Consciousness: awake  Airway and Oxygen Therapy: Patient Spontanous Breathing  Post-op Pain: mild  Post-op Assessment: Post-op Vital signs reviewed, Patient's Cardiovascular Status Stable, Respiratory Function Stable, Patent Airway, No signs of Nausea or vomiting and Pain level controlled  Post-op Vital Signs: stable  Complications: No apparent anesthesia complications

## 2013-04-14 NOTE — Op Note (Signed)
04/14/2013  10:10 AM  PATIENT:  Felicia Gentry  67 y.o. 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 Decompressive anterior cervical discectomy C4-5 and C6-7, 2. Anterior cervical arthrodesis C4-5 and C6-7 utilizing cortico-cancellus allografts, 3. Anterior cervical plating C4-5 and C6-7 utilizing helix plates  SURGEON:  Marikay Alar, MD  ASSISTANTS: Dr. Franky Macho  ANESTHESIA:   General  EBL: Minimal ml  Total I/O In: 1000 [I.V.:1000] Out: -   BLOOD ADMINISTERED:none  DRAINS: None   SPECIMEN:  No Specimen  INDICATION FOR PROCEDURE: This patient underwent a posterior cervical fusion from C3-C7 [facility] in the remote past. She had a long history of neck with headache. Plain films and CT scan and MRI showed cervical spondylosis of the cervical spine with pseudoarthrosis at C4-5 and C6-7 and loosening of the C7 hardware. I recommended ACDF with plating at C4-5 and C6-7 in hopes of improving her pain syndrome.Patient understood the risks, benefits, and alternatives and potential outcomes and wished to proceed.  PROCEDURE DETAILS: Patient was brought to the operating room placed under general endotracheal anesthesia. Patient was placed in the supine position on the operating room table. The neck was prepped with Duraprep and draped in a sterile fashion.   Three cc of local anesthesia was injected and a transverse incision was made on the right side of the neck.  Dissection was carried down thru the subcutaneous tissue and the platysma was  elevated, opened, and undermined with Metzenbaum scissors.  Dissection was then carried out thru an avascular plane leaving the sternocleidomastoid carotid artery and jugular vein laterally and the trachea and esophagus medially. The ventral aspect of the vertebral column was identified and a localizing  x-ray was taken. The C4-5 level was identified. The longus colli muscles were then elevated from C4-C7 and the retractor was placed. The annulus at C4-5 and at C6-7 was incised and the disc space entered. Discectomy was performed with micro-curettes and pituitary rongeurs. I then used the high-speed drill to drill the endplates down to the level of the posterior longitudinal ligament at both levels. The drill shavings were saved in a mucous trap for later arthrodesis. The operating microscope was draped and brought into the field provided additional magnification, illumination and visualization. Discectomy was continued posteriorly thru the disc space. Posterior longitudinal ligament was opened with a nerve hook, and then removed along with disc herniation and osteophytes, decompressing the spinal canal and thecal sac. We then continued to remove osteophytic overgrowth and disc material decompressing the neural foramina and exiting nerve roots bilaterally. The scope was angled up and down to help decompress and undercut the vertebral bodies. Once the decompression was completed we could pass a nerve hook circumferentially to assure adequate decompression in the midline and in the neural foramina. So by both visualization and palpation we felt we had an adequate decompression of the neural elements. We then measured the height of the intravertebral disc space and selected a 7 mm millimeter  allograft. It was then gently positioned in the intravertebral disc space and countersunk at both levels. I then used a helix plate and placed four variable angle screws into the vertebral bodies at both levels and locked them into position. The wound was irrigated with bacitracin solution, checked for hemostasis which was established and confirmed. Once meticulous hemostasis was achieved, we then proceeded with closure. The platysma was  closed with interrupted 3-0 undyed Vicryl suture, the subcuticular layer was closed with  interrupted 3-0 undyed Vicryl suture. The skin edges were approximated with steristrips. The drapes were removed. A sterile dressing was applied. The patient was then awakened from general anesthesia and transferred to the recovery room in stable condition. At the end of the procedure all sponge, needle and instrument counts were correct.   PLAN OF CARE: Admit to inpatient   PATIENT DISPOSITION:  PACU - hemodynamically stable.   Delay start of Pharmacological VTE agent (>24hrs) due to surgical blood loss or risk of bleeding:  yes

## 2013-04-14 NOTE — Preoperative (Signed)
Beta Blockers   Reason not to administer Beta Blockers:Not Applicable 

## 2013-04-14 NOTE — Progress Notes (Signed)
UR COMPLETED  

## 2013-04-14 NOTE — H&P (Signed)
Subjective:   Patient is a 67 y.o. female admitted for ACDF. The patient first presented to me with complaints of nec pain. Onset of symptoms was years ago. The pain is described as aching and occurs daily. The pain is rated severe, and is located at the base of the neck and radiates to the head and shoulders. The symptoms have been progressive. Symptoms are exacerbated by movement, and are relieved by nothing.  Previous work up includes CT which shows pseudoarthrosis C4-5, C6-7 after remote C3-C7 PCF.  Past Medical History  Diagnosis Date  . Coronary artery disease   . Dysrhythmia   . Shortness of breath     with walking  . COPD (chronic obstructive pulmonary disease)   . Pneumonia   . History of kidney stones   . Cancer     Breast  . History of blood transfusion     Past Surgical History  Procedure Laterality Date  . Cardiac catheterization    . Breast surgery Left 2008  . Breast reconstruction  2005  . Abdominal hysterectomy    . Cesarean section      x 2  . Bladder suspension    . Splenectomy, partial    . Tear duct probing    . Lung surgery Left 2003ish    polyps- unable to diagnosis with biospy  . Carpal tunnel release Bilateral   . Mouth surgery      repair fro m scerderma  . Rotator cuff repair Right     x3  . Shoulder surgery Right 10/04/11    reverse total  . Cervical spine surgery      x2  . Knee arthroscopy Right     x2  . Joint replacement Bilateral   . Mastectomy Left     Allergies  Allergen Reactions  . Morphine And Related Other (See Comments)    unconciousness for 5 days  . Adhesive (Tape)     Blisters   . Betadine (Povidone Iodine)     itching  . Cephalexin     Hives   . Codeine     vomiting  . Penicillins     hives  . Sulfa Antibiotics     Hives     History  Substance Use Topics  . Smoking status: Former Smoker    Quit date: 04/14/1989  . Smokeless tobacco: Not on file  . Alcohol Use: No    History reviewed. No pertinent family  history. Prior to Admission medications   Medication Sig Start Date End Date Taking? Authorizing Provider  albuterol (PROVENTIL HFA;VENTOLIN HFA) 108 (90 BASE) MCG/ACT inhaler Inhale 2 puffs into the lungs every 4 (four) hours as needed for wheezing.   Yes Historical Provider, MD  Cholecalciferol 2000 UNITS TABS Take 1 tablet by mouth daily.   Yes Historical Provider, MD  cyclobenzaprine (FLEXERIL) 5 MG tablet Take 5 mg by mouth at bedtime.   Yes Historical Provider, MD  ferrous gluconate (FERGON) 225 (27 FE) MG tablet Take 240 mg by mouth every morning.   Yes Historical Provider, MD  FLUoxetine (PROZAC) 40 MG capsule Take 40 mg by mouth daily.   Yes Historical Provider, MD  Fluticasone-Salmeterol (ADVAIR DISKUS) 250-50 MCG/DOSE AEPB Inhale 1 puff into the lungs 2 (two) times daily as needed.   Yes Historical Provider, MD  furosemide (LASIX) 20 MG tablet Take 20 mg by mouth 2 (two) times daily.   Yes Historical Provider, MD  gabapentin (NEURONTIN) 300 MG capsule Take 600 mg by  mouth 3 (three) times daily.   Yes Historical Provider, MD  hydroxychloroquine (PLAQUENIL) 200 MG tablet Take 200 mg by mouth 2 (two) times daily.   Yes Historical Provider, MD  letrozole (FEMARA) 2.5 MG tablet Take 2.5 mg by mouth 2 (two) times daily.   Yes Historical Provider, MD  metoCLOPramide (REGLAN) 10 MG tablet Take 10 mg by mouth at bedtime.   Yes Historical Provider, MD  NIFEdipine (PROCARDIA-XL/ADALAT-CC/NIFEDICAL-XL) 30 MG 24 hr tablet Take 30 mg by mouth daily.   Yes Historical Provider, MD  omeprazole (PRILOSEC) 40 MG capsule Take 40 mg by mouth daily.   Yes Historical Provider, MD  rosuvastatin (CRESTOR) 20 MG tablet Take 20 mg by mouth daily.   Yes Historical Provider, MD  zolpidem (AMBIEN) 10 MG tablet Take 10 mg by mouth at bedtime as needed for sleep.   Yes Historical Provider, MD     Review of Systems  Positive ROS: neg  All other systems have been reviewed and were otherwise negative with the  exception of those mentioned in the HPI and as above.  Objective: Vital signs in last 24 hours: Temp:  [97.1 F (36.2 C)] 97.1 F (36.2 C) (06/18 0606) Pulse Rate:  [85] 85 (06/18 0606) Resp:  [18] 18 (06/18 0606) BP: (143)/(83) 143/83 mmHg (06/18 0606) SpO2:  [94 %] 94 % (06/18 0606)  General Appearance: Alert, cooperative, no distress, appears stated age Head: Normocephalic, without obvious abnormality, atraumatic Eyes: PERRL, conjunctiva/corneas clear, EOM's intact      Neck: Supple, symmetrical, trachea midline, atrophy of cervical musculature Back: Symmetric, no curvature, ROM normal, no CVA tenderness Lungs:  respirations unlabored Heart: Regular rate and rhythm Abdomen: Soft, non-tender Extremities: Extremities normal, atraumatic, no cyanosis or edema Pulses: 2+ and symmetric all extremities Skin: Skin color, texture, turgor normal, no rashes or lesions  NEUROLOGIC:  Mental status: Alert and oriented x4, no aphasia, good attention span, fund of knowledge and memory  Motor Exam - grossly normal Sensory Exam - grossly normal Reflexes: 1+ Coordination - grossly normal Gait - grossly normal Balance - grossly normal Cranial Nerves: I: smell Not tested  II: visual acuity  OS: nl    OD: nl  II: visual fields Full to confrontation  II: pupils Equal, round, reactive to light  III,VII: ptosis None  III,IV,VI: extraocular muscles  Full ROM  V: mastication Normal  V: facial light touch sensation  Normal  V,VII: corneal reflex  Present  VII: facial muscle function - upper  Normal  VII: facial muscle function - lower Normal  VIII: hearing Not tested  IX: soft palate elevation  Normal  IX,X: gag reflex Present  XI: trapezius strength  5/5  XI: sternocleidomastoid strength 5/5  XI: neck flexion strength  5/5  XII: tongue strength  Normal    Data Review Lab Results  Component Value Date   WBC 8.5 04/09/2013   HGB 14.8 04/09/2013   HCT 45.3 04/09/2013   MCV 83.4  04/09/2013   PLT 118* 04/09/2013   Lab Results  Component Value Date   NA 139 04/09/2013   K 4.7 04/09/2013   CL 104 04/09/2013   CO2 26 04/09/2013   BUN 14 04/09/2013   CREATININE 0.59 04/09/2013   GLUCOSE 80 04/09/2013   Lab Results  Component Value Date   INR 0.98 04/09/2013    Assessment:   Cervical neck pain with herniated nucleus pulposus/ spondylosis/ stenosis with psuedoarthrosis  at C4-5 and C6-7 after remote PCF. Patient has failed conservative  therapy. Planned surgery : ACDF C4-5, C6-7.  Plan:   I explained the condition and procedure to the patient and answered any questions.  Patient wishes to proceed with procedure as planned. Understands risks/ benefits/ and expected or typical outcomes.  Felicia Gentry S 04/14/2013 7:26 AM

## 2013-04-14 NOTE — Anesthesia Preprocedure Evaluation (Addendum)
Anesthesia Evaluation  Patient identified by MRN, date of birth, ID band Patient awake    Reviewed: Allergy & Precautions, H&P , NPO status , Patient's Chart, lab work & pertinent test results  Airway Mallampati: I TM Distance: >3 FB Neck ROM: Full    Dental   Pulmonary shortness of breath, COPDformer smoker,  breath sounds clear to auscultation        Cardiovascular + CAD Rhythm:Regular Rate:Normal  lbbb   Neuro/Psych    GI/Hepatic   Endo/Other    Renal/GU      Musculoskeletal   Abdominal   Peds  Hematology   Anesthesia Other Findings   Reproductive/Obstetrics                         Anesthesia Physical Anesthesia Plan  ASA: III  Anesthesia Plan: General   Post-op Pain Management:    Induction: Intravenous  Airway Management Planned: Oral ETT  Additional Equipment:   Intra-op Plan:   Post-operative Plan: Extubation in OR  Informed Consent: I have reviewed the patients History and Physical, chart, labs and discussed the procedure including the risks, benefits and alternatives for the proposed anesthesia with the patient or authorized representative who has indicated his/her understanding and acceptance.     Plan Discussed with: CRNA and Surgeon  Anesthesia Plan Comments:         Anesthesia Quick Evaluation

## 2013-04-15 ENCOUNTER — Encounter (HOSPITAL_COMMUNITY): Payer: Self-pay | Admitting: Neurological Surgery

## 2013-04-15 MED ORDER — OXYCODONE-ACETAMINOPHEN 5-325 MG PO TABS: 1.0000 | ORAL_TABLET | ORAL | Status: AC | PRN

## 2013-04-15 NOTE — Discharge Summary (Signed)
Physician Discharge Summary  Patient ID: Felicia Gentry MRN: [id number] DOB/AGE: 1945-11-24 67 y.o.  Admit date: 04/14/2013 Discharge date: 04/15/2013  Admission Diagnoses: Cervical spondylosis with pseudoarthrosis    Discharge Diagnoses: Same   Discharged Condition: good  Hospital Course: The patient was admitted on 04/14/2013 and taken to the operating room where the patient underwent ACDF C4-5 and C6-7. The patient tolerated the procedure well and was taken to the recovery room and then to the floor in stable condition. The hospital course was routine. There were no complications. The wound remained clean dry and intact. Pt had appropriate mild neck soreness. No complaints of arm pain or new N/T/W. The patient remained afebrile with stable vital signs, and tolerated a regular diet. The patient continued to increase activities, and pain was well controlled with oral pain medications.   Consults: None  Significant Diagnostic Studies:  Results for orders placed during the hospital encounter of 04/09/13  SURGICAL PCR SCREEN      Result Value Range   MRSA, PCR NEGATIVE  NEGATIVE   Staphylococcus aureus POSITIVE (*) NEGATIVE  BASIC METABOLIC PANEL      Result Value Range   Sodium 139  135 - 145 mEq/L   Potassium 4.7  3.5 - 5.1 mEq/L   Chloride 104  96 - 112 mEq/L   CO2 26  19 - 32 mEq/L   Glucose, Bld 80  70 - 99 mg/dL   BUN 14  6 - 23 mg/dL   Creatinine, Ser 4.09  0.50 - 1.10 mg/dL   Calcium 9.7  8.4 - 81.1 mg/dL   GFR calc non Af Amer >90  >90 mL/min   GFR calc Af Amer >90  >90 mL/min  CBC WITH DIFFERENTIAL      Result Value Range   WBC 8.5  4.0 - 10.5 K/uL   RBC 5.43 (*) 3.87 - 5.11 MIL/uL   Hemoglobin 14.8  12.0 - 15.0 g/dL   HCT 91.4  78.2 - 95.6 %   MCV 83.4  78.0 - 100.0 fL   MCH 27.3  26.0 - 34.0 pg   MCHC 32.7  30.0 - 36.0 g/dL   RDW 21.3 (*) 08.6 - 57.8 %   Platelets 118 (*) 150 - 400 K/uL   Neutrophils Relative % 48  43 - 77 %   Neutro Abs 4.1  1.7 - 7.7  K/uL   Lymphocytes Relative 25  12 - 46 %   Lymphs Abs 2.2  0.7 - 4.0 K/uL   Monocytes Relative 10  3 - 12 %   Monocytes Absolute 0.9  0.1 - 1.0 K/uL   Eosinophils Relative 14 (*) 0 - 5 %   Eosinophils Absolute 1.2 (*) 0.0 - 0.7 K/uL   Basophils Relative 2 (*) 0 - 1 %   Basophils Absolute 0.1  0.0 - 0.1 K/uL  PROTIME-INR      Result Value Range   Prothrombin Time 12.9  11.6 - 15.2 seconds   INR 0.98  0.00 - 1.49    Dg Cervical Spine 2-3 Views  04/14/2013   *RADIOLOGY REPORT*  Clinical Data: ACDF C4 - C5 and C6 - C7  DG C-ARM 1-60 MIN, CERVICAL SPINE - 2-3 VIEW  Comparison: Cervical spine CT - 03/04/2013  Findings:  Two spot lateral intraoperative radiographic images of the cervical spine are provided for review.  Sequela of bilateral paraspinal fusion extending from C3 - C7, incompletely evaluated.  Interval C4 - C5 and C6 - C7 ACDF and  intervertebral disc space replacement, though note, the caudal aspect of the hardware is as degraded secondary to overlying osseous and soft tissue structures. There is apparent restoration of the C4 - C5 and C5 - C6 intervertebral disc space heights.  Grossly unchanged findings of osseous fusion of the C5 - C6 intervertebral disc space.  An endotracheal tube overlies the tracheal air column with tip excluded from view.  No definite radiopaque foreign body.  IMPRESSION: 1.  Post C4 - C5 and C6 - C7 ACDF. 2.  Grossly stable sequela of bilateral C3 - C7 paraspinal fusion, incompletely evaluate   Original Report Authenticated By: Tacey Ruiz, MD   Dg C-arm 1-60 Min  04/14/2013   *RADIOLOGY REPORT*  Clinical Data: ACDF C4 - C5 and C6 - C7  DG C-ARM 1-60 MIN, CERVICAL SPINE - 2-3 VIEW  Comparison: Cervical spine CT - 03/04/2013  Findings:  Two spot lateral intraoperative radiographic images of the cervical spine are provided for review.  Sequela of bilateral paraspinal fusion extending from C3 - C7, incompletely evaluated.  Interval C4 - C5 and C6 - C7 ACDF and  intervertebral disc space replacement, though note, the caudal aspect of the hardware is as degraded secondary to overlying osseous and soft tissue structures. There is apparent restoration of the C4 - C5 and C5 - C6 intervertebral disc space heights.  Grossly unchanged findings of osseous fusion of the C5 - C6 intervertebral disc space.  An endotracheal tube overlies the tracheal air column with tip excluded from view.  No definite radiopaque foreign body.  IMPRESSION: 1.  Post C4 - C5 and C6 - C7 ACDF. 2.  Grossly stable sequela of bilateral C3 - C7 paraspinal fusion, incompletely evaluate   Original Report Authenticated By: Tacey Ruiz, MD    Antibiotics:  Anti-infectives   Start     Dose/Rate Route Frequency Ordered Stop   04/14/13 2200  hydroxychloroquine (PLAQUENIL) tablet 200 mg     200 mg Oral 2 times daily 04/14/13 1145     04/14/13 0854  bacitracin 50,000 Units in sodium chloride irrigation 0.9 % 500 mL irrigation  Status:  Discontinued       As needed 04/14/13 0854 04/14/13 1014   04/14/13 0818  vancomycin (VANCOCIN) 1 GM/200ML IVPB    Comments:  BECKNER, ZACK: cabinet override      04/14/13 0818 04/14/13 0817   04/14/13 0811  bacitracin 16109 UNITS injection    Comments:  STEELMAN, CRAIG: cabinet override      04/14/13 0811 04/14/13 2014   04/14/13 0600  ceFAZolin (ANCEF) IVPB 2 g/50 mL premix  Status:  Discontinued     2 g 100 mL/hr over 30 Minutes Intravenous On call to O.R. 04/13/13 1517 04/14/13 1135      Discharge Exam: Blood pressure 102/66, pulse 92, temperature 98.8 F (37.1 C), temperature source Oral, resp. rate 18, SpO2 93.00%. Neurologic: Grossly normal Incision clean dry and intact  Discharge Medications:     Medication List    TAKE these medications       ADVAIR DISKUS 250-50 MCG/DOSE Aepb  Generic drug:  Fluticasone-Salmeterol  Inhale 1 puff into the lungs 2 (two) times daily as needed.     albuterol 108 (90 BASE) MCG/ACT inhaler  Commonly known as:   PROVENTIL HFA;VENTOLIN HFA  Inhale 2 puffs into the lungs every 4 (four) hours as needed for wheezing.     Cholecalciferol 2000 UNITS Tabs  Take 1 tablet by mouth daily.  cyclobenzaprine 5 MG tablet  Commonly known as:  FLEXERIL  Take 5 mg by mouth at bedtime.     ferrous gluconate 225 (27 FE) MG tablet  Commonly known as:  FERGON  Take 240 mg by mouth every morning.     FLUoxetine 40 MG capsule  Commonly known as:  PROZAC  Take 40 mg by mouth daily.     furosemide 20 MG tablet  Commonly known as:  LASIX  Take 20 mg by mouth 2 (two) times daily.     gabapentin 300 MG capsule  Commonly known as:  NEURONTIN  Take 600 mg by mouth 3 (three) times daily.     hydroxychloroquine 200 MG tablet  Commonly known as:  PLAQUENIL  Take 200 mg by mouth 2 (two) times daily.     letrozole 2.5 MG tablet  Commonly known as:  FEMARA  Take 5 mg by mouth daily.     metoCLOPramide 10 MG tablet  Commonly known as:  REGLAN  Take 10 mg by mouth at bedtime.     NIFEdipine 30 MG 24 hr tablet  Commonly known as:  PROCARDIA-XL/ADALAT-CC/NIFEDICAL-XL  Take 30 mg by mouth daily.     omeprazole 40 MG capsule  Commonly known as:  PRILOSEC  Take 40 mg by mouth daily.     oxyCODONE-acetaminophen 5-325 MG per tablet  Commonly known as:  PERCOCET/ROXICET  Take 1-2 tablets by mouth every 4 (four) hours as needed.     rosuvastatin 20 MG tablet  Commonly known as:  CRESTOR  Take 20 mg by mouth daily.     zolpidem 10 MG tablet  Commonly known as:  AMBIEN  Take 10 mg by mouth at bedtime as needed for sleep.        Disposition: Home   Final Dx: ACDF C4-5 and C6-7 with plating      Discharge Orders   Future Orders Complete By Expires     Call MD for:  difficulty breathing, headache or visual disturbances  As directed     Call MD for:  persistant nausea and vomiting  As directed     Call MD for:  redness, tenderness, or signs of infection (pain, swelling, redness, odor or green/yellow  discharge around incision site)  As directed     Call MD for:  severe uncontrolled pain  As directed     Call MD for:  temperature >100.4  As directed     Diet - low sodium heart healthy  As directed     Discharge instructions  As directed     Comments:      No heavy lifting, no driving, avoid neck extension    Increase activity slowly  As directed     Remove dressing in 48 hours  As directed        Follow-up Information   Follow up with Osmel Dykstra S, MD In 2 weeks.   Contact information:   [address] [place] [address] [phone]        Signed: Tia Alert 04/15/2013, 8:35 AM

## 2013-04-15 NOTE — Plan of Care (Signed)
Problem: Consults Goal: Diagnosis - Spinal Surgery Outcome: Completed/Met Date Met:  04/15/13 Cervical Spine Fusion     

## 2013-05-27 ENCOUNTER — Ambulatory Visit: Payer: Self-pay | Admitting: Internal Medicine

## 2013-08-12 ENCOUNTER — Ambulatory Visit: Payer: Self-pay | Admitting: Ophthalmology

## 2013-08-24 ENCOUNTER — Ambulatory Visit: Payer: Self-pay | Admitting: Ophthalmology

## 2013-08-31 ENCOUNTER — Ambulatory Visit: Payer: Self-pay | Admitting: Internal Medicine

## 2013-09-06 ENCOUNTER — Ambulatory Visit: Payer: Self-pay | Admitting: Specialist

## 2013-09-08 ENCOUNTER — Ambulatory Visit: Payer: Self-pay | Admitting: Ophthalmology

## 2013-09-14 ENCOUNTER — Ambulatory Visit: Payer: Self-pay | Admitting: Ophthalmology

## 2013-11-23 ENCOUNTER — Ambulatory Visit: Payer: Self-pay | Admitting: Internal Medicine

## 2013-12-07 ENCOUNTER — Ambulatory Visit: Payer: Self-pay | Admitting: Internal Medicine

## 2013-12-08 ENCOUNTER — Ambulatory Visit: Payer: Self-pay | Admitting: Internal Medicine

## 2013-12-08 LAB — COMPREHENSIVE METABOLIC PANEL
ALBUMIN: 3.6 g/dL (ref 3.4–5.0)
ALK PHOS: 263 U/L — AB
ALT: 29 U/L (ref 12–78)
AST: 41 U/L — AB (ref 15–37)
Anion Gap: 3 — ABNORMAL LOW (ref 7–16)
BUN: 16 mg/dL (ref 7–18)
Bilirubin,Total: 0.6 mg/dL (ref 0.2–1.0)
CALCIUM: 9.8 mg/dL (ref 8.5–10.1)
CREATININE: 0.64 mg/dL (ref 0.60–1.30)
Chloride: 103 mmol/L (ref 98–107)
Co2: 32 mmol/L (ref 21–32)
EGFR (African American): >60
EGFR (Non-African Amer.): >60
GLUCOSE: 87 mg/dL (ref 65–99)
OSMOLALITY: 276 (ref 275–301)
POTASSIUM: 4.4 mmol/L (ref 3.5–5.1)
Sodium: 138 mmol/L (ref 136–145)
TOTAL PROTEIN: 8.2 g/dL (ref 6.4–8.2)

## 2013-12-08 LAB — CBC CANCER CENTER
BASOS ABS: 0.2 x10 3/mm — AB (ref 0.0–0.1)
BASOS PCT: 2.1 %
Eosinophil #: 1.1 x10 3/mm — ABNORMAL HIGH (ref 0.0–0.7)
Eosinophil %: 12.7 %
HCT: 45.7 % (ref 35.0–47.0)
HGB: 14.9 g/dL (ref 12.0–16.0)
LYMPHS PCT: 24.1 %
Lymphocyte #: 2.1 x10 3/mm (ref 1.0–3.6)
MCH: 28.3 pg (ref 26.0–34.0)
MCHC: 32.7 g/dL (ref 32.0–36.0)
MCV: 87 fL (ref 80–100)
MONOS PCT: 9.9 %
Monocyte #: 0.8 x10 3/mm (ref 0.2–0.9)
NEUTROS PCT: 51.2 %
Neutrophil #: 4.4 x10 3/mm (ref 1.4–6.5)
Platelet: 99 x10 3/mm — ABNORMAL LOW (ref 150–440)
RBC: 5.28 10*6/uL — AB (ref 3.80–5.20)
RDW: 17.1 % — ABNORMAL HIGH (ref 11.5–14.5)
WBC: 8.6 x10 3/mm (ref 3.6–11.0)

## 2013-12-09 LAB — CEA: CEA: 0.7 ng/mL (ref 0.0–4.7)

## 2013-12-09 LAB — CANCER ANTIGEN 27.29: CA 27.29: 29.3 U/mL (ref 0.0–38.6)

## 2013-12-24 ENCOUNTER — Ambulatory Visit: Payer: Self-pay | Admitting: Unknown Physician Specialty

## 2013-12-26 ENCOUNTER — Ambulatory Visit: Payer: Self-pay | Admitting: Internal Medicine

## 2013-12-27 LAB — PATHOLOGY REPORT

## 2014-01-12 ENCOUNTER — Ambulatory Visit: Payer: Self-pay | Admitting: Internal Medicine

## 2014-01-26 ENCOUNTER — Ambulatory Visit: Payer: Self-pay | Admitting: Internal Medicine

## 2014-02-13 ENCOUNTER — Inpatient Hospital Stay: Payer: Self-pay | Admitting: Internal Medicine

## 2014-02-13 LAB — CBC
HCT: 41.3 % (ref 35.0–47.0)
HGB: 13.1 g/dL (ref 12.0–16.0)
MCH: 27.8 pg (ref 26.0–34.0)
MCHC: 31.8 g/dL — AB (ref 32.0–36.0)
MCV: 87 fL (ref 80–100)
PLATELETS: 42 10*3/uL — AB (ref 150–440)
RBC: 4.73 10*6/uL (ref 3.80–5.20)
RDW: 16.1 % — ABNORMAL HIGH (ref 11.5–14.5)
WBC: 7.4 10*3/uL (ref 3.6–11.0)

## 2014-02-13 LAB — PRO B NATRIURETIC PEPTIDE: B-Type Natriuretic Peptide: 345 pg/mL — ABNORMAL HIGH (ref 0–125)

## 2014-02-13 LAB — BASIC METABOLIC PANEL
ANION GAP: 4 — AB (ref 7–16)
BUN: 11 mg/dL (ref 7–18)
CHLORIDE: 107 mmol/L (ref 98–107)
CREATININE: 0.61 mg/dL (ref 0.60–1.30)
Calcium, Total: 9.3 mg/dL (ref 8.5–10.1)
Co2: 29 mmol/L (ref 21–32)
EGFR (Non-African Amer.): >60
Glucose: 86 mg/dL (ref 65–99)
Osmolality: 278 (ref 275–301)
Potassium: 4.3 mmol/L (ref 3.5–5.1)
SODIUM: 140 mmol/L (ref 136–145)

## 2014-02-13 LAB — TROPONIN I: Troponin-I: <0.02 ng/mL

## 2014-02-14 LAB — BASIC METABOLIC PANEL
ANION GAP: 6 — AB (ref 7–16)
BUN: 15 mg/dL (ref 7–18)
CALCIUM: 9.4 mg/dL (ref 8.5–10.1)
CHLORIDE: 103 mmol/L (ref 98–107)
Co2: 31 mmol/L (ref 21–32)
Creatinine: 0.64 mg/dL (ref 0.60–1.30)
EGFR (African American): >60
GLUCOSE: 91 mg/dL (ref 65–99)
OSMOLALITY: 280 (ref 275–301)
Potassium: 3.8 mmol/L (ref 3.5–5.1)
SODIUM: 140 mmol/L (ref 136–145)

## 2014-02-14 LAB — CBC WITH DIFFERENTIAL/PLATELET
BASOS PCT: 1 %
Basophil #: 0.1 10*3/uL (ref 0.0–0.1)
Eosinophil #: 0 10*3/uL (ref 0.0–0.7)
Eosinophil %: 0.3 %
HCT: 40.8 % (ref 35.0–47.0)
HGB: 12.9 g/dL (ref 12.0–16.0)
LYMPHS ABS: 1.6 10*3/uL (ref 1.0–3.6)
Lymphocyte %: 18.1 %
MCH: 27.5 pg (ref 26.0–34.0)
MCHC: 31.7 g/dL — AB (ref 32.0–36.0)
MCV: 87 fL (ref 80–100)
MONO ABS: 1.1 x10 3/mm — AB (ref 0.2–0.9)
MONOS PCT: 12.6 %
NEUTROS ABS: 6.1 10*3/uL (ref 1.4–6.5)
Neutrophil %: 68 %
PLATELETS: 38 10*3/uL — AB (ref 150–440)
RBC: 4.7 10*6/uL (ref 3.80–5.20)
RDW: 16.4 % — ABNORMAL HIGH (ref 11.5–14.5)
WBC: 8.9 10*3/uL (ref 3.6–11.0)

## 2014-02-15 LAB — CBC WITH DIFFERENTIAL/PLATELET
Basophil #: 0.1 10*3/uL (ref 0.0–0.1)
Basophil %: 1.1 %
EOS PCT: 1.3 %
Eosinophil #: 0.2 10*3/uL (ref 0.0–0.7)
HCT: 39.2 % (ref 35.0–47.0)
HGB: 12.6 g/dL (ref 12.0–16.0)
LYMPHS ABS: 1.1 10*3/uL (ref 1.0–3.6)
Lymphocyte %: 9.8 %
MCH: 28.1 pg (ref 26.0–34.0)
MCHC: 32.1 g/dL (ref 32.0–36.0)
MCV: 88 fL (ref 80–100)
Monocyte #: 1 x10 3/mm — ABNORMAL HIGH (ref 0.2–0.9)
Monocyte %: 8.7 %
NEUTROS PCT: 79.1 %
Neutrophil #: 8.9 10*3/uL — ABNORMAL HIGH (ref 1.4–6.5)
PLATELETS: 29 10*3/uL — AB (ref 150–440)
RBC: 4.47 10*6/uL (ref 3.80–5.20)
RDW: 16.2 % — ABNORMAL HIGH (ref 11.5–14.5)
WBC: 11.3 10*3/uL — ABNORMAL HIGH (ref 3.6–11.0)

## 2014-02-15 LAB — BASIC METABOLIC PANEL
ANION GAP: 7 (ref 7–16)
BUN: 24 mg/dL — AB (ref 7–18)
CO2: 26 mmol/L (ref 21–32)
Calcium, Total: 8.9 mg/dL (ref 8.5–10.1)
Chloride: 100 mmol/L (ref 98–107)
Creatinine: 0.8 mg/dL (ref 0.60–1.30)
EGFR (African American): >60
EGFR (Non-African Amer.): >60
Glucose: 136 mg/dL — ABNORMAL HIGH (ref 65–99)
Osmolality: 273 (ref 275–301)
Potassium: 4.6 mmol/L (ref 3.5–5.1)
Sodium: 133 mmol/L — ABNORMAL LOW (ref 136–145)

## 2014-02-16 LAB — CBC WITH DIFFERENTIAL/PLATELET
BASOS PCT: 0.6 %
Basophil #: 0 10*3/uL (ref 0.0–0.1)
EOS PCT: 0 %
Eosinophil #: 0 10*3/uL (ref 0.0–0.7)
HCT: 39.9 % (ref 35.0–47.0)
HGB: 12.7 g/dL (ref 12.0–16.0)
LYMPHS PCT: 9.9 %
Lymphocyte #: 0.5 10*3/uL — ABNORMAL LOW (ref 1.0–3.6)
MCH: 27.8 pg (ref 26.0–34.0)
MCHC: 32 g/dL (ref 32.0–36.0)
MCV: 87 fL (ref 80–100)
MONO ABS: 0.4 x10 3/mm (ref 0.2–0.9)
MONOS PCT: 8 %
NEUTROS ABS: 4 10*3/uL (ref 1.4–6.5)
Neutrophil %: 81.5 %
Platelet: 31 10*3/uL — ABNORMAL LOW (ref 150–440)
RBC: 4.59 10*6/uL (ref 3.80–5.20)
RDW: 16 % — ABNORMAL HIGH (ref 11.5–14.5)
WBC: 4.9 10*3/uL (ref 3.6–11.0)

## 2014-02-16 LAB — BASIC METABOLIC PANEL
Anion Gap: 6 — ABNORMAL LOW (ref 7–16)
BUN: 24 mg/dL — AB (ref 7–18)
CALCIUM: 8.4 mg/dL — AB (ref 8.5–10.1)
CHLORIDE: 100 mmol/L (ref 98–107)
CREATININE: 0.69 mg/dL (ref 0.60–1.30)
Co2: 29 mmol/L (ref 21–32)
Glucose: 204 mg/dL — ABNORMAL HIGH (ref 65–99)
OSMOLALITY: 280 (ref 275–301)
POTASSIUM: 3.8 mmol/L (ref 3.5–5.1)
SODIUM: 135 mmol/L — AB (ref 136–145)

## 2014-02-17 LAB — BASIC METABOLIC PANEL
ANION GAP: 5 — AB (ref 7–16)
BUN: 28 mg/dL — ABNORMAL HIGH (ref 7–18)
CALCIUM: 8.6 mg/dL (ref 8.5–10.1)
CO2: 30 mmol/L (ref 21–32)
Chloride: 99 mmol/L (ref 98–107)
Creatinine: 0.73 mg/dL (ref 0.60–1.30)
EGFR (African American): >60
EGFR (Non-African Amer.): >60
Glucose: 75 mg/dL (ref 65–99)
OSMOLALITY: 272 (ref 275–301)
Potassium: 3.5 mmol/L (ref 3.5–5.1)
SODIUM: 134 mmol/L — AB (ref 136–145)

## 2014-02-17 LAB — CBC WITH DIFFERENTIAL/PLATELET
Basophil #: 0 10*3/uL (ref 0.0–0.1)
Basophil %: 0.4 %
EOS ABS: 0 10*3/uL (ref 0.0–0.7)
Eosinophil %: 0.1 %
HCT: 39.5 % (ref 35.0–47.0)
HGB: 12.9 g/dL (ref 12.0–16.0)
LYMPHS PCT: 13.4 %
Lymphocyte #: 1.2 10*3/uL (ref 1.0–3.6)
MCH: 28.2 pg (ref 26.0–34.0)
MCHC: 32.7 g/dL (ref 32.0–36.0)
MCV: 87 fL (ref 80–100)
Monocyte #: 1.1 x10 3/mm — ABNORMAL HIGH (ref 0.2–0.9)
Monocyte %: 12.4 %
Neutrophil #: 6.4 10*3/uL (ref 1.4–6.5)
Neutrophil %: 73.7 %
PLATELETS: 35 10*3/uL — AB (ref 150–440)
RBC: 4.57 10*6/uL (ref 3.80–5.20)
RDW: 16.1 % — AB (ref 11.5–14.5)
WBC: 8.7 10*3/uL (ref 3.6–11.0)

## 2014-02-25 ENCOUNTER — Ambulatory Visit: Payer: Self-pay | Admitting: Internal Medicine

## 2014-09-20 ENCOUNTER — Ambulatory Visit: Payer: Self-pay | Admitting: Internal Medicine

## 2015-01-02 IMAGING — CT CT CHEST W/O CM
2 of 4 series · 15 of 36 positions shown, 18 images · non-contrast
Comparison: Most recent prior similar exam 09/30/2012, 02/10/2008,
03/06/2012

CLINICAL DATA: Followup pulmonary nodules

EXAM:
CT CHEST WITHOUT CONTRAST
TECHNIQUE: Multidetector CT imaging of the chest was performed following the
standard protocol without IV contrast.

[Series 2: routine chest wo · axial · 0.66mm/px · z∈[-687,-427]mm · 12 of 62 slices shown, 15 images]
[im 5/62  mediastinal]
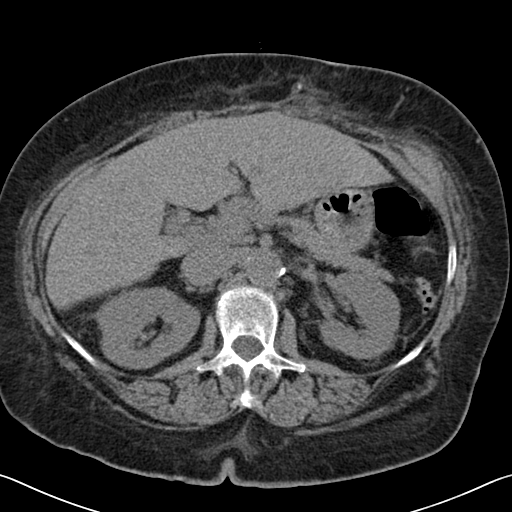
[im 5/62  lung]
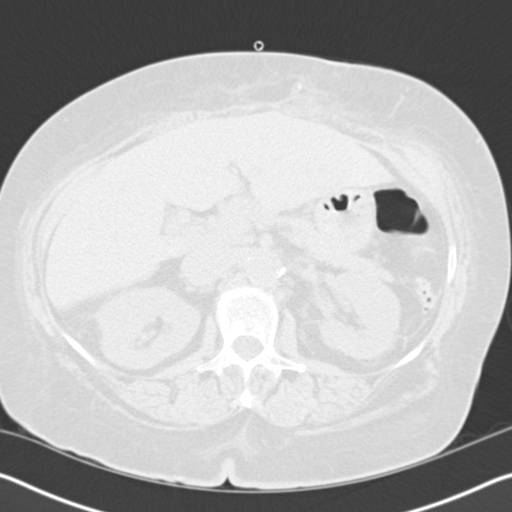
[im 10/62  lung]
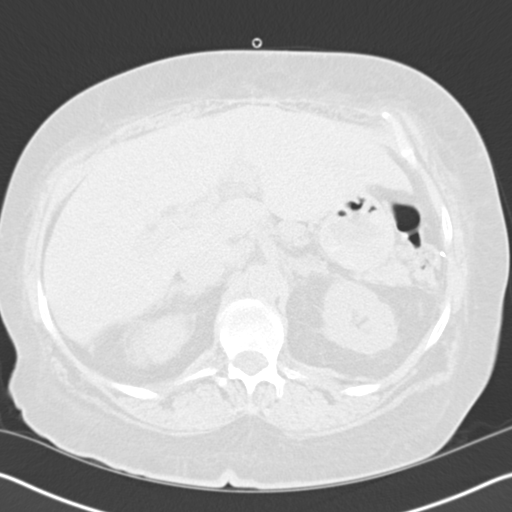
[im 15/62  lung]
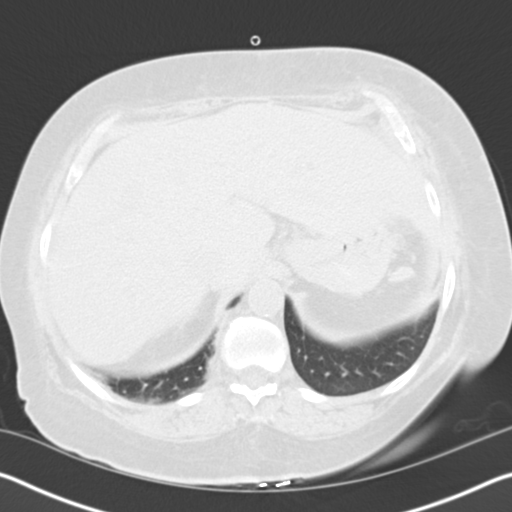
[im 19/62  lung]
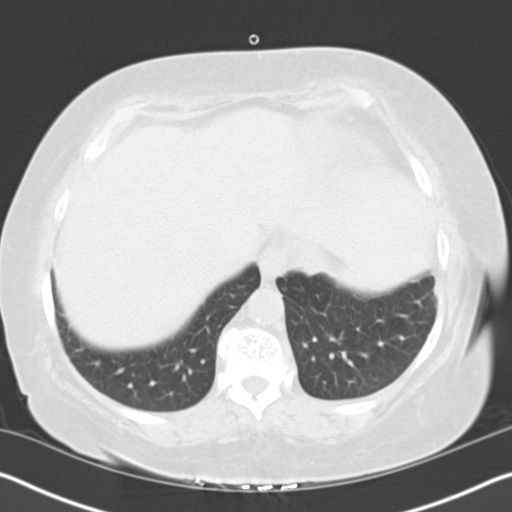
[im 24/62  mediastinal]
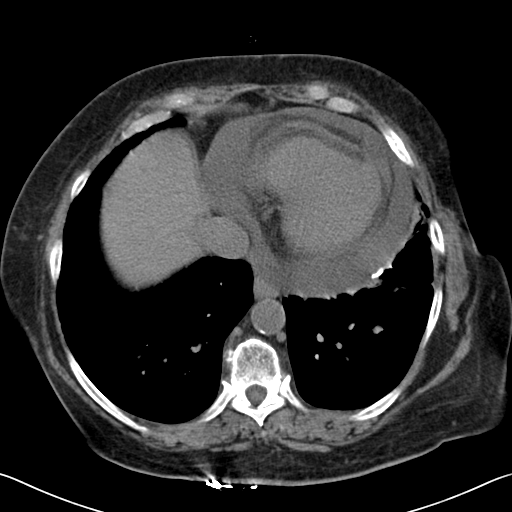
[im 24/62  lung]
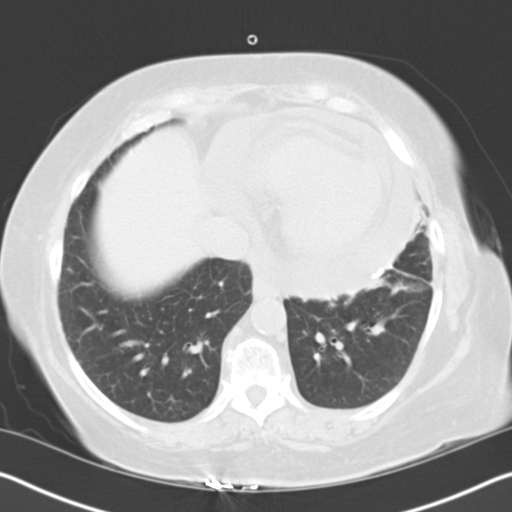
[im 29/62  lung]
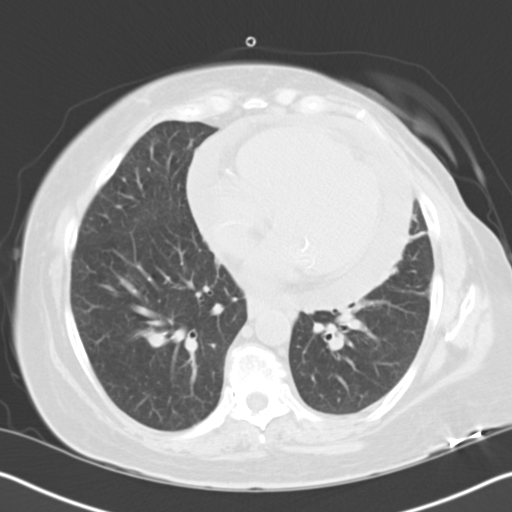
[im 33/62  lung]
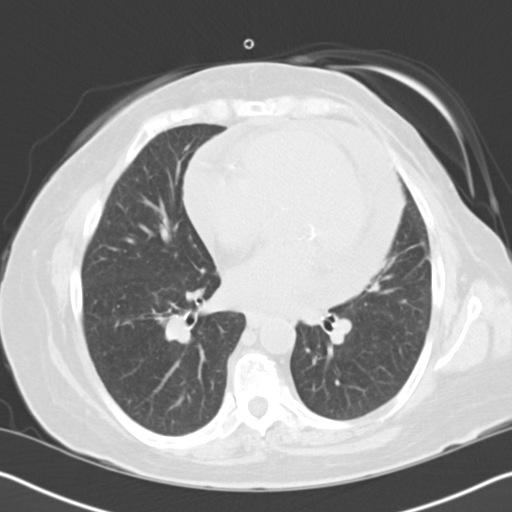
[im 38/62  lung]
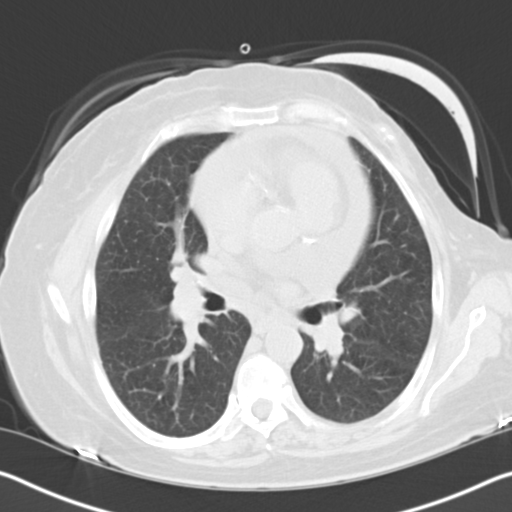
[im 43/62  mediastinal]
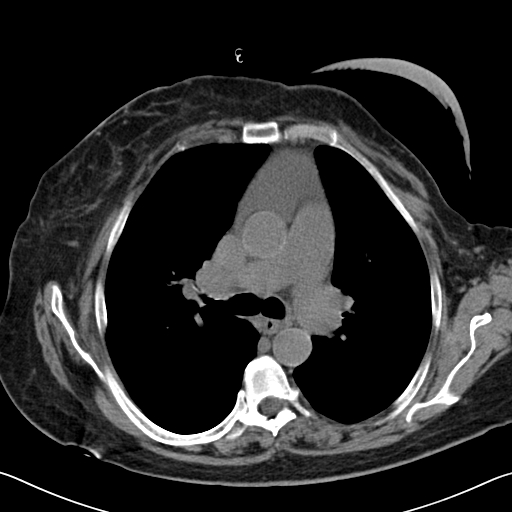
[im 43/62  lung]
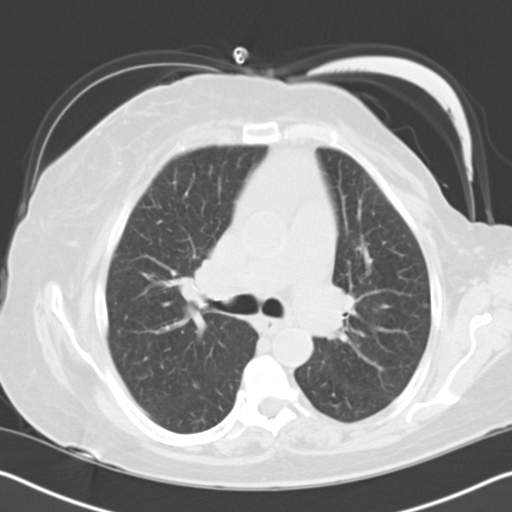
[im 47/62  lung]
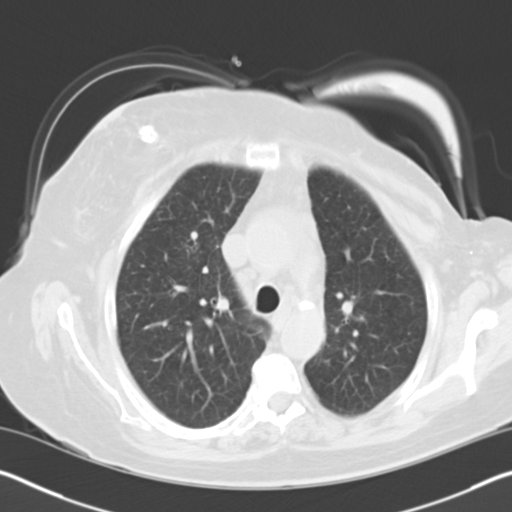
[im 52/62  lung]
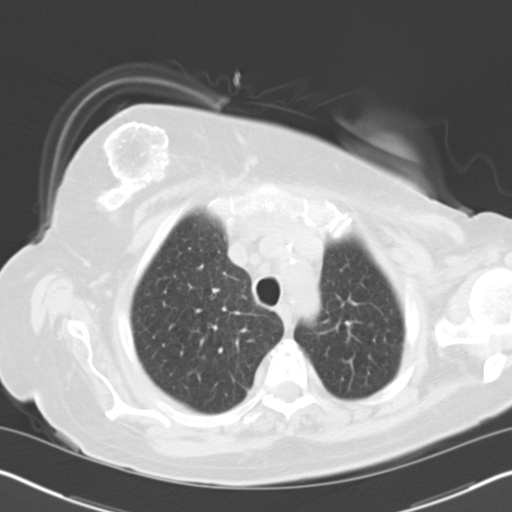
[im 57/62  lung]
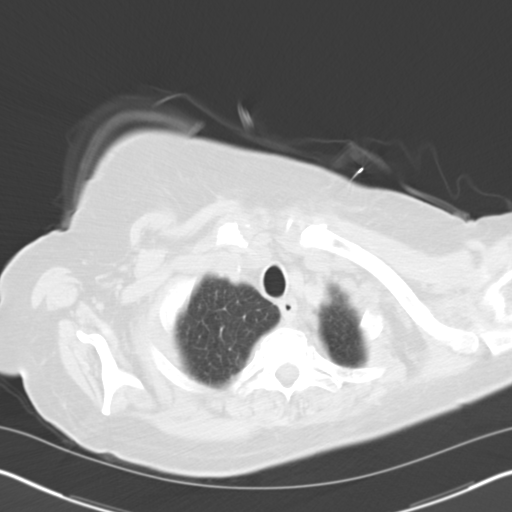

[Series 5: cor routine chest wo · coronal · 0.68mm/px · 3 of 140 slices shown]
[im 28/140  lung]
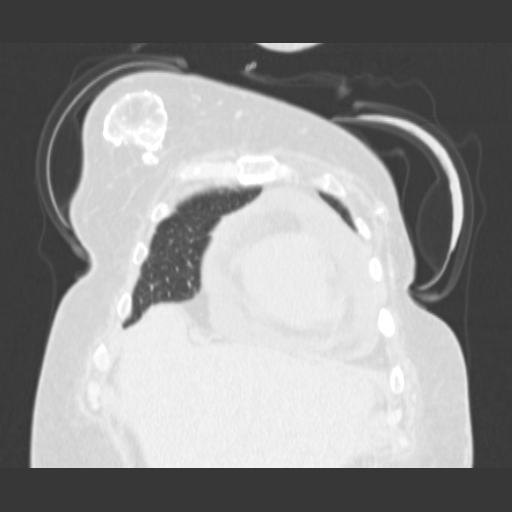
[im 56/140  lung]
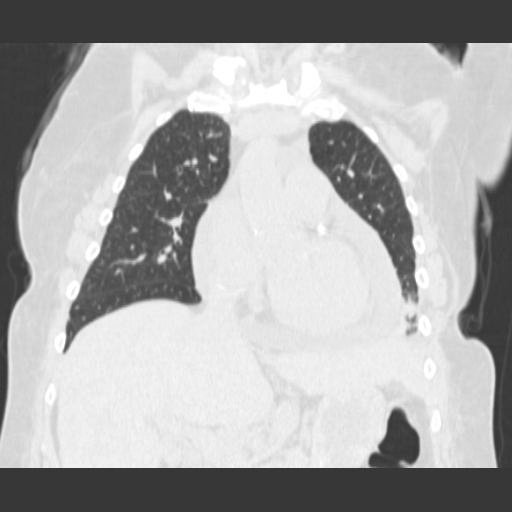
[im 84/140  lung]
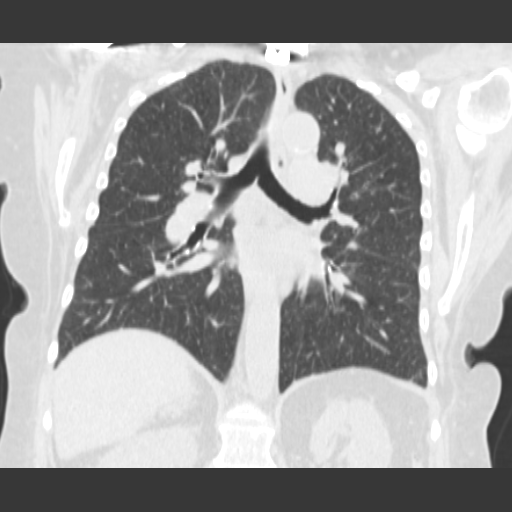

[15 of 36 positions shown; findings below may reference images not displayed]

FINDINGS: Cervical fusion hardware partly visualized. Rim calcified masslike
areas within the right breast are most compatible with fat necrosis.
Evidence of left mastectomy reidentified.

Mild diffuse emphysematous changes are reidentified. Streak artifact
from right shoulder hemiarthroplasty noted. Subpleural right upper
lobe presumed scarring image 13 again noted. Several representative
3 mm right lower lobe pulmonary nodules image 36 are stable. 4 mm
left lower lobe pulmonary nodule image 38 is stable. Lingular and
superior segment left lower lobe scarring is stable. No new
pulmonary nodule, consolidation, or mass is identified. Central
airways are patent. Degenerative changes are reidentified in the
spine.

Moderate pericardial effusion is larger than previously. Heart size
is normal. No lymphadenopathy.

No acute osseous abnormality.
IMPRESSION: Allowing for differences in technique, no significant change in
bilateral pulmonary nodules since 03/06/2012. Followup chest CT is
recommended in 6-12 months to demonstrate a total of 2 year
stability and establish probable benignity.

Increase in size of now moderate pericardial effusion.

Mild emphysematous changes.

## 2015-02-17 NOTE — Op Note (Signed)
PATIENT NAME:  Felicia Gentry, Felicia Gentry MR#:  [id number] DATE OF BIRTH:  1946-04-26  DATE OF PROCEDURE:  08/24/2013  PREOPERATIVE DIAGNOSIS: Visually significant cataract of the left eye.   POSTOPERATIVE DIAGNOSIS: Visually significant cataract of the left eye.   OPERATIVE PROCEDURE: Cataract extraction by phacoemulsification with implant of intraocular lens to left eye.   SURGEON: Birder Robson, MD.   ANESTHESIA:  1. Managed anesthesia care.  2. Topical tetracaine drops followed by 2% Xylocaine jelly applied in the preoperative holding area.   COMPLICATIONS: None.   TECHNIQUE:  Stop and chop.   DESCRIPTION OF PROCEDURE: The patient was examined and consented in the preoperative holding area where the aforementioned topical anesthesia was applied to the left eye and then brought back to the Operating Room where the left eye was prepped and draped in the usual sterile ophthalmic fashion and a lid speculum was placed. A paracentesis was created with the side port blade and the anterior chamber was filled with viscoelastic. A near clear corneal incision was performed with the steel keratome. A continuous curvilinear capsulorrhexis was performed with a cystotome followed by the capsulorrhexis forceps. Hydrodissection and hydrodelineation were carried out with BSS on a blunt cannula. The lens was removed in a stop and chop technique and the remaining cortical material was removed with the irrigation-aspiration handpiece. The capsular bag was inflated with viscoelastic and the Tecnis ZCB00, 19.0-diopter lens, serial number [id number] was placed in the capsular bag without complication. The remaining viscoelastic was removed from the eye with the irrigation-aspiration handpiece. The wounds were hydrated. The anterior chamber was flushed with Miostat and the eye was inflated to physiologic pressure. 0.1 mL of Vigamox concentration 10 mg/mL was placed in the anterior chamber. The wounds were found to be water  tight. The eye was dressed with Vigamox. The patient was given protective glasses to wear throughout the day and a shield with which to sleep tonight. The patient was also given drops with which to begin a drop regimen today and will follow-up with me in one day.   Please note that due to a CEPHALOSPORIN ALLERGY, Vigamox was placed in the anterior chamber rather than CEFUROXIME.     ____________________________ Livingston Diones. Blanche Scovell, MD wlp:mr D: 08/24/2013 18:37:13 ET T: 08/24/2013 19:26:38 ET JOB#: [id number]  cc: Shontelle Muska L. Elza Varricchio, MD, <Dictator> Livingston Diones Emmakate Hypes MD ELECTRONICALLY SIGNED 08/30/2013 10:30

## 2015-02-17 NOTE — Op Note (Signed)
PATIENT NAME:  Felicia Gentry, Felicia Gentry MR#:  [id number] DATE OF BIRTH:  18-Apr-1946  DATE OF PROCEDURE:  09/14/2013  PREOPERATIVE DIAGNOSIS: Visually significant cataract of the right eye.   POSTOPERATIVE DIAGNOSIS: Visually significant cataract of the right eye.   OPERATIVE PROCEDURE: Cataract extraction by phacoemulsification with implant of intraocular lens to the right eye.   SURGEON: Birder Robson, MD.   ANESTHESIA:  1. Managed anesthesia care.  2. Topical tetracaine drops followed by 2% Xylocaine jelly applied in the preoperative holding area.   COMPLICATIONS: None.   TECHNIQUE:  Stop and chop.    DESCRIPTION OF PROCEDURE: The patient was examined and consented in the preoperative holding area where the aforementioned topical anesthesia was applied to the right eye and then brought back to the Operating Room where the right eye was prepped and draped in the usual sterile ophthalmic fashion and a lid speculum was placed. A paracentesis was created with the side port blade and the anterior chamber was filled with viscoelastic. A near clear corneal incision was performed with the steel keratome. A continuous curvilinear capsulorrhexis was performed with a cystotome followed by the capsulorrhexis forceps. Hydrodissection and hydrodelineation were carried out with BSS on a blunt cannula. The lens was removed in a stop and chop technique and the remaining cortical material was removed with the irrigation-aspiration handpiece. The capsular bag was inflated with viscoelastic and the Tecnis ZCB00 19.5-diopter lens, serial number [id number] was placed in the capsular bag without complication. The remaining viscoelastic was removed from the eye with the irrigation-aspiration handpiece. The wounds were hydrated. The anterior chamber was flushed with Miostat and the eye was inflated to physiologic pressure. Please note that cefuroxime was not placed in the eye. Instead, I injected 0.2 mL of a 50/50 mixture  of Vigamox and BSS solution. The wounds were found to be water tight. The eye was dressed with Vigamox. The patient was given protective glasses to wear throughout the day and a shield with which to sleep tonight. The patient was also given drops with which to begin a drop regimen today and will follow-up with me in one day.   ____________________________ Livingston Diones. Gerre Ranum, MD wlp:gb D: 09/14/2013 20:41:13 ET T: 09/14/2013 21:13:51 ET JOB#: [id number]  cc: Locklan Canoy L. Fredrick Dray, MD, <Dictator> Livingston Diones Darsha Zumstein MD ELECTRONICALLY SIGNED 09/15/2013 13:27

## 2015-02-18 NOTE — H&P (Signed)
PATIENT NAME:  Felicia Gentry, SLAVEN MR#:  [id number] DATE OF BIRTH:  07/27/1946  DATE OF ADMISSION:  02/13/2014  PRIMARY CARE PHYSICIAN: Leonie Douglas. Sparks, MD  CHIEF COMPLAINT: Shortness of breath and cough.   HISTORY OF PRESENT ILLNESS: This is a 69 year old female who presents to the hospital due to shortness of breath and cough that has been progressively getting worse over the past 2 to 3 days. The patient has a history of underlying congestive heart failure, pulmonary hypertension and chronic obstructive pulmonary disease. She has been taking her breathing treatments and also using her Lasix but her symptoms are not improving. She does have a cough which is productive with clear gray sputum. She denies any paroxysmal nocturnal dyspnea or any orthopnea but she does admit to some weight gain and lower extremity edema over the past few days. Since her clinical symptoms were not improving, she was brought to the ER for further evaluation. In the Emergency Room, the patient was noted to be hypoxic and in acute on chronic hypoxic respiratory failure. Urgently placed on BiPAP. Her chest x-ray findings were suggestive of congestive heart failure. Hospitalist services were contacted for further treatment and evaluation. The patient admits to some chest tightness but no true chest pain. Positive nausea but no vomiting, no documented fever. No sick contacts. No other upper respiratory symptoms.   REVIEW OF SYSTEMS:    CONSTITUTIONAL: No documented fever. Positive weight gain, unknown how much.  No weight loss.  EYES: No blurred or double vision.  ENT: No tinnitus. No postnasal drip. No redness of the oropharynx.  RESPIRATORY: Positive cough. No wheeze. No hemoptysis. Positive dyspnea.  CARDIOVASCULAR: No chest pain. No orthopnea. No palpitations, no syncope.  GASTROINTESTINAL: Positive nausea. No vomiting. No diarrhea. No abdominal pain. No melena or hematochezia.  GENITOURINARY: No dysuria or hematuria.   ENDOCRINE: No polyuria or nocturia. No heat or cold intolerance.  HEMATOLOGIC: No anemia, bruising or bleeding.  INTEGUMENTARY: No rashes. No lesions.  MUSCULOSKELETAL: No arthritis. No swelling. No gout.  NEUROLOGIC: No numbness or tingling. No ataxia. No seizure-type activity.  PSYCHIATRIC: Positive anxiety. Positive depression. No ADD.   PAST MEDICAL HISTORY: Consistent with history of chronic obstructive pulmonary disease, pulmonary hypertension, scleroderma, history of congestive heart failure, neuropathy, hyperlipidemia, hypertension, hypothyroidism, GERD.   ALLERGIES: ASPIRIN, BETADINE, CODEINE, DARVOCET, KEFLEX, MORPHINE, NORVASC, PENICILLIN, ROXICET, SULFA DRUGS, TYLENOL WITH CODEINE.   SOCIAL HISTORY: Used to be a smoker, quit about 20+ years ago, does have a 30+ year smoking history. No alcohol abuse. No illicit drug abuse. Lives at home with her husband.   FAMILY HISTORY: Mother and father are both deceased. Mother died from a myocardial infarction but she had Alzheimer's dementia. Father died of chronic lung disease.   CURRENT MEDICATIONS: As follows: Advair 250/50 one puff b.i.d., albuterol nebulizer as needed, Crestor 40 mg daily, Flexeril 5 mg at bedtime, iron gluconate 240 mg tab 1 tab in the morning, fluoxetine 40 mg daily, Lasix 40 mg daily, gabapentin 300 mg t.i.d., Plaquenil 200 mg b.i.d., letrozole 2.5 mg 2 tabs daily, Synthroid 50 mcg daily, Reglan 10 mg at bedtime, nifedipine 30 mg daily, omeprazole 40 mg daily, albuterol inhaler 2 puffs q.4 to 6 hours as needed and Ambien 10 mg at bedtime.   PHYSICAL EXAMINATION: Presently is as follows:  VITAL SIGNS: Temperature is 98.5, pulse 82, respirations 24, blood pressure 124/63, sats 97% on BiPAP.  GENERAL: She is a pleasant-appearing female in mild respiratory distress.  HEAD, EYES, EARS, NOSE,  THROAT: Atraumatic, normocephalic. Her extraocular muscles are intact. Her pupils are equal and reactive on to light. Sclerae  anicteric. No conjunctival injection. No pharyngeal erythema.  NECK: Supple. There is no jugular venous distention. No bruits, no lymphadenopathy, no thyromegaly.  HEART: Regular rate and rhythm. No murmurs, no rubs, no clicks.  LUNGS: She has end-expiratory wheezing, positive use of accessory muscles. No dullness to percussion. Minimal crackles at the bases.  ABDOMEN: Soft, flat, nontender, nondistended. Has good bowel sounds. No hepatosplenomegaly appreciated.  EXTREMITIES: No evidence of any cyanosis or clubbing. She does have +1 pitting edema on the right as compared to the left from the knees and ankles.  SKIN: Moist and warm with no rashes.  LYMPHATIC: There is no cervical or axillary lymphadenopathy.  NEUROLOGICAL: The patient is alert, awake and oriented x 3 with no focal motor or sensory deficits appreciated bilaterally.   LABORATORY DATA: Serum glucose of 86. BNP of 345. BUN 11, creatinine 0.6, sodium 140, potassium 4.3, chloride 107, bicarbonate 29. Troponin less than 0.02. White cell count is 7.4, hemoglobin 13.1, hematocrit 41.3, platelet count of 42,000.   ASSESSMENT AND PLAN: This is a 69 year old female with a history of chronic obstructive pulmonary disease, pulmonary hypertension, history of congestive heart failure, neuropathy, hyperlipidemia, hypertension, hypothyroidism, gastroesophageal reflux disease, presented to the hospital with shortness of breath and lower extremity edema, progressively getting worse.  1.  Acute on chronic respiratory failure. This is likely due to decompensated congestive heart failure. I will diurese her with IV Lasix, follow ins and outs and daily weights. She has other reasons to have chronic respiratory failure given her pulmonary hypertension and underlying chronic obstructive pulmonary disease.  2.  Congestive heart failure. This is likely acute on chronic diastolic dysfunction. I will diurese her with IV Lasix, follow ins and outs and daily weights.  Continue her nifedipine for now.  3.  History of chronic obstructive pulmonary disease. She has a mild chronic obstructive pulmonary disease exacerbation. I advised putting her on some IV steroids although the patient does not want to take steroids presently. I will keep her on her DuoNebs around the clock. Continue her Advair and follow her clinically. She is already on oxygen at home.  4.  Hypothyroidism. Continue her Synthroid.  5.  History of breast cancer. Continue letrozole.  6.  Depression. Continue Prozac.  7.  Hyperlipidemia. Continue Crestor.  8.  Gastroesophageal reflux disease. Continue Protonix.  9.  Thrombocytopenia. This seems like chronic for the patient. She follows up with Dr. Inez Pilgrim. She has no acute bleeding. I will follow her platelet count. Hold heparin products for now. 10.  CODE STATUS: The patient is a full code.   TIME SPENT: 50 minutes.   ____________________________ Belia Heman. Verdell Carmine, MD vjs:cs D: 02/13/2014 13:53:50 ET T: 02/13/2014 14:26:54 ET JOB#: [id number]  cc: Belia Heman. Verdell Carmine, MD, <Dictator> Henreitta Leber MD ELECTRONICALLY SIGNED 02/21/2014 10:46

## 2015-02-18 NOTE — Discharge Summary (Signed)
PATIENT NAME:  Felicia Gentry, DALPE MR#:  [id number] DATE OF BIRTH:  April 20, 1946  DATE OF ADMISSION:  02/13/2014 DATE OF DISCHARGE:  02/17/2014  REASON FOR ADMISSION: Shortness of breath and cough.   HISTORY OF PRESENT ILLNESS: Please see the dictated HPI done by Dr. Verdell Carmine on 02/13/2014.   PAST MEDICAL HISTORY: 1.  Chronic respiratory failure.  2.  Pulmonary hypertension.  3.  COPD. 4.  Scleroderma.  5.  Chronic diastolic congestive heart failure.  6.  Peripheral neuropathy.  7.  Hyperlipidemia.  8.  Benign hypertension.  9.  Hypothyroidism.  10.  GE reflux disease.   MEDICATIONS ON ADMISSION: Please see admission note.   ALLERGIES: ASPIRIN, BETADINE, CODEINE, DARVOCET, KEFLEX, MORPHINE, NORVASC, PENICILLIN, ROXICET, SULFA, AND TYLENOL WITH CODEINE.   SOCIAL AND FAMILY HISTORY AND REVIEW OF SYSTEMS: As per admission note.   PHYSICAL EXAMINATION: GENERAL: The patient was in mild respiratory distress.  VITAL SIGNS: Stable and she was afebrile. Sats were 97% on BiPAP.  HEENT: Unremarkable.  NECK: Supple without JVD.  LUNGS: Revealed expiratory wheezes with decreased breath sounds and basilar crackles.  CARDIAC: Regular rate and rhythm with normal S1 and S2.  ABDOMEN: Soft and nontender.  EXTREMITIES: Revealed 1+ edema.  NEUROLOGIC: Grossly nonfocal.   HOSPITAL COURSE: The patient was admitted with acute on chronic respiratory failure with acute on chronic diastolic congestive heart failure as well as COPD exacerbation. She was started on IV Lasix. Her pulmonary toilet was maximized. Her respiratory status improved. She was seen by palliative care who recommended hospice. The family agreed. She was maintained on oxygen. She was weaned down to her baseline 3 liters. Chest x-ray showed improvement. By 02/17/2014, the patient was stable and ready for discharge.   DISCHARGE DIAGNOSES: 1.  Acute on chronic respiratory failure.  2.  Acute on chronic diastolic congestive heart failure.   3.  Chronic obstructive pulmonary disease exacerbation.  4.  Pulmonary hypertension.  5.  Benign hypertension.  6.  Hyperlipidemia.  7.  Hypothyroidism.  8.  Scleroderma.   DISCHARGE MEDICATIONS: 1.  Gabapentin 300 mg p.o. t.i.d.  2.  Prozac 40 mg p.o. daily.  3.  Reglan 10 mg p.o. at bedtime.  4.  Crestor 20 mg p.o. daily.  5.  Advair 250/50 one puff b.i.d.,  6.  Synthroid 50 mcg p.o. daily.  7.  Plaquenil 200 mg p.o. b.i.d.  8.  Letrozole 2.5 mg p.o. daily.  9.  Ambien 10 mg p.o. at bedtime.  10.  Lasix 40 mg p.o. b.i.d.  11.  Flexeril 5 mg p.o. at bedtime p.r.n.  12.  Prednisone taper as directed.  13.  Losartan 25 mg p.o. daily.  14.  Zofran 4 mg every 4 hours p.r.n. nausea or vomiting.  15.  DuoNeb SVNs q.i.d.  16.  Spiriva 1 capsule inhaled daily.  17.  Singulair 10 mg p.o. daily.  18.  Protonix 40 mg p.o. daily.  19.  Levaquin 250 mg p.o. daily x1 week.  20.  Adcirca 20 mg p.o. daily.   FOLLOW-UP PLANS AND APPOINTMENTS: The patient was discharged on oxygen at 3 to 4 liters according to her sats. She will be followed by hospice. She is on a low sodium diet. Follow up with me in 1 to 2 weeks, sooner if needed.   ____________________________ Leonie Douglas Doy Hutching, MD jds:sb D: 02/24/2014 12:04:24 ET T: 02/24/2014 12:21:12 ET JOB#: [id number]  cc: Leonie Douglas. Doy Hutching, MD, <Dictator> JEFFREY Lennice Sites MD ELECTRONICALLY SIGNED 02/24/2014 17:14

## 2015-06-12 IMAGING — CR DG CHEST 2V
1 series · 2 of 2 positions shown · non-contrast
Comparison: DG CHEST 1V PORT dated 02/13/2014

CLINICAL DATA: Shortness of breath.  Weakness.

EXAM:
CHEST  2 VIEW

[Series 3: x chest ap · 0.14mm/px · 2 of 2 slices shown]
[im 1/2]
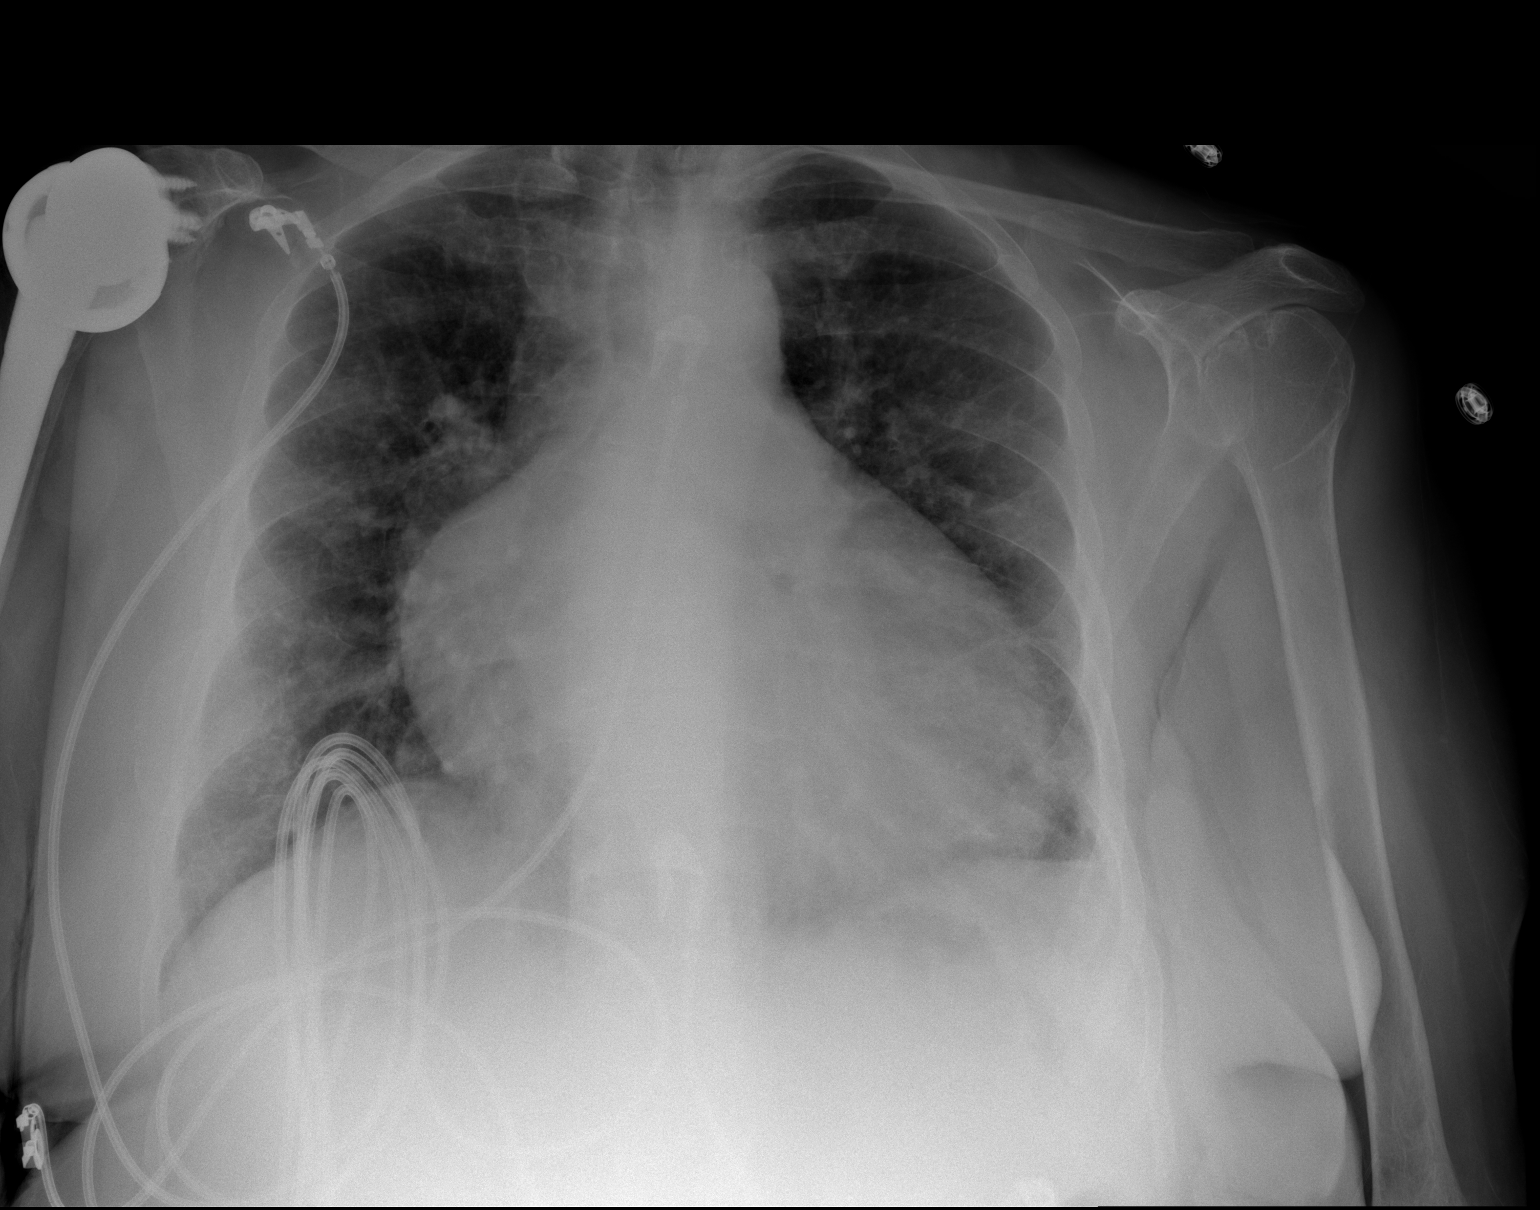
[im 2/2]
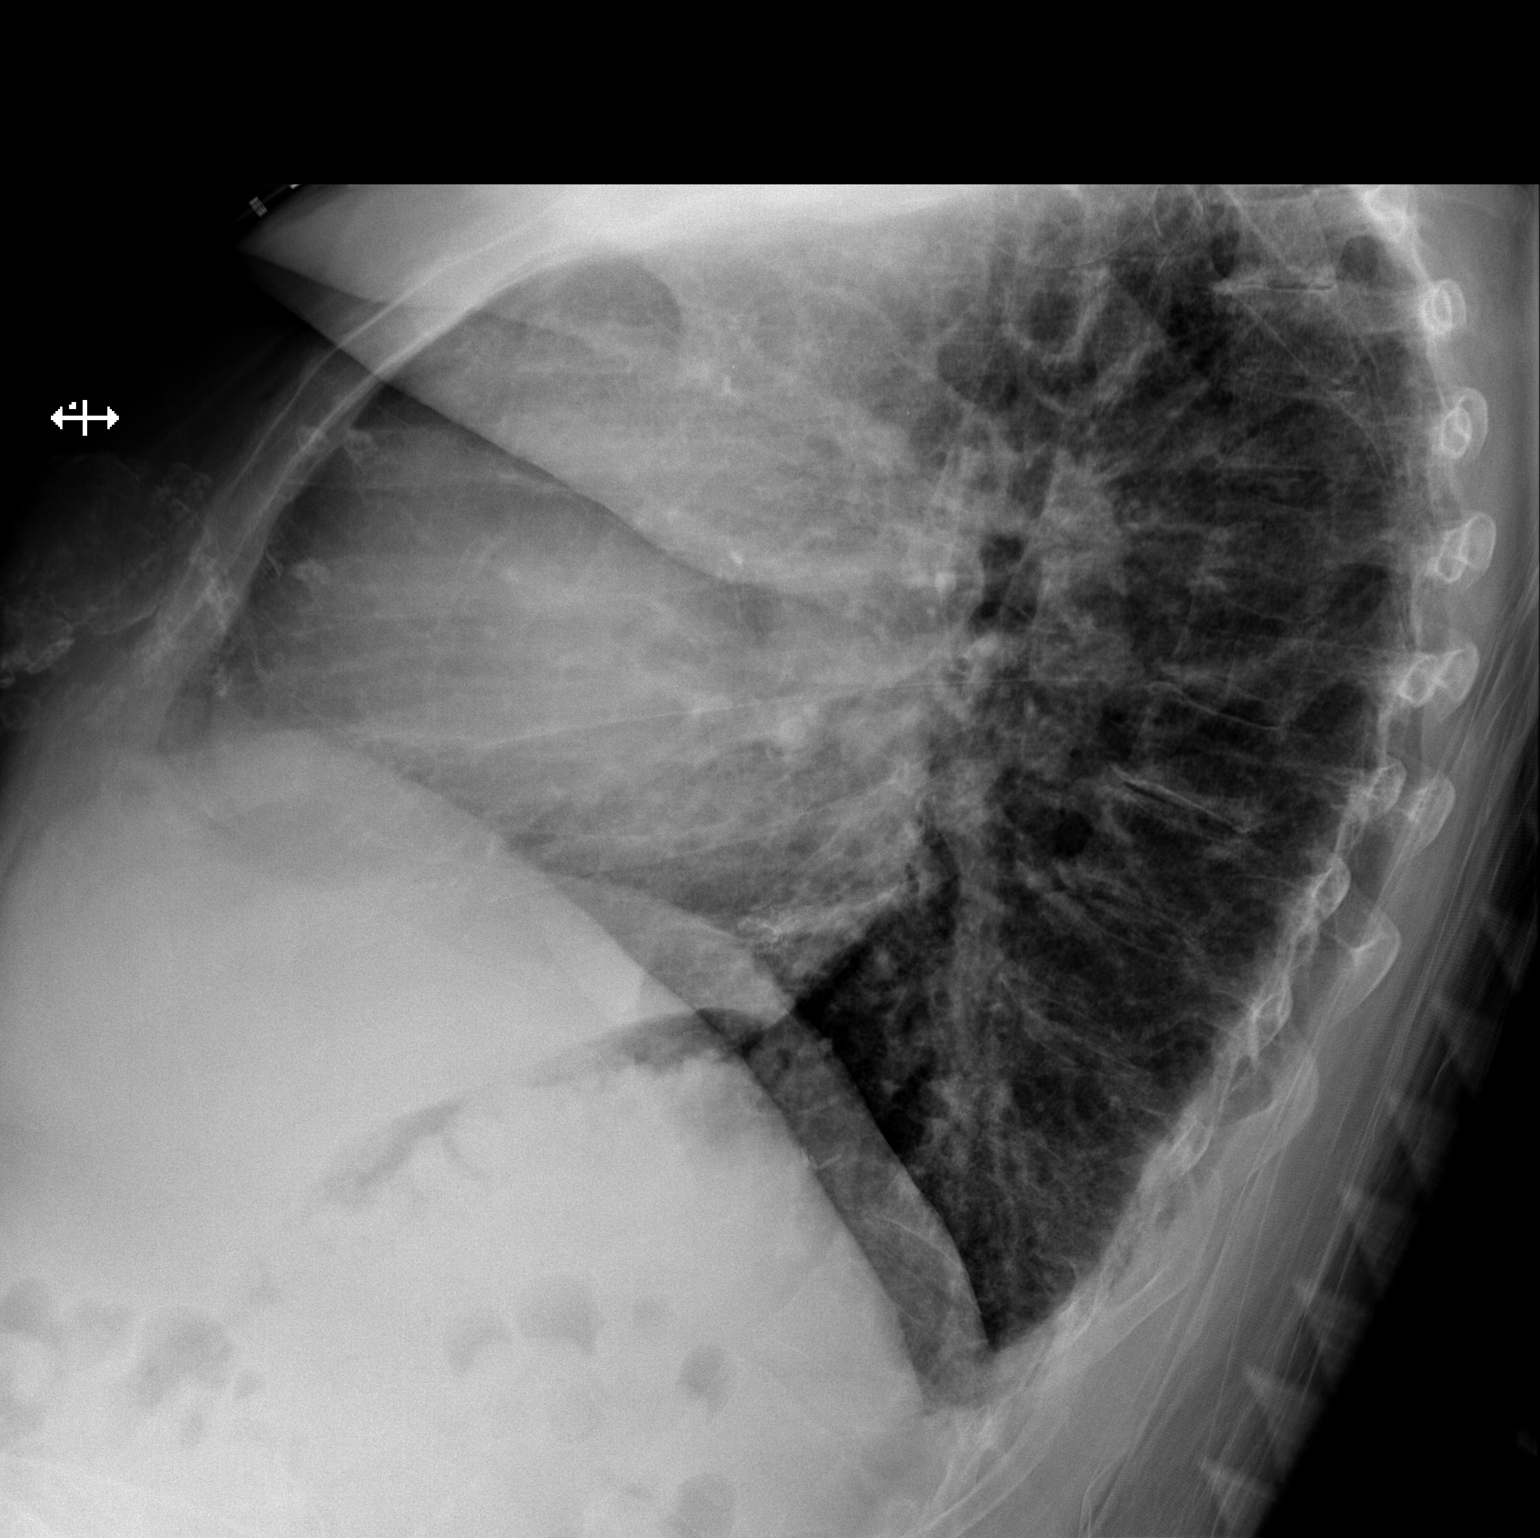

[2 of 2 positions shown; findings below may reference images not displayed]

FINDINGS: Persistent severe cardiomegaly. Pulmonary vascularity is stable.
Previously identified pulmonary edema has partially cleared. Small
left pleural effusion noted. No pneumothorax. Prior cervical spine
fusion and right shoulder replacement.
IMPRESSION: 1. Persistent severe cardiomegaly.  Pulmonary vascularity is stable.
2. Interim slight clearing of pulmonary interstitial edema. Small
left pleural effusion remains.

## 2016-01-15 ENCOUNTER — Emergency Department
Admission: EM | Admit: 2016-01-15 | Discharge: 2016-01-15 | Disposition: A | Discharge status: Home / Self Care | Attending: Emergency Medicine | Admitting: Emergency Medicine

## 2016-01-15 ENCOUNTER — Emergency Department

## 2016-01-15 ENCOUNTER — Encounter: Payer: Self-pay | Admitting: *Deleted

## 2016-01-15 DIAGNOSIS — S4992XA Unspecified injury of left shoulder and upper arm, initial encounter: Secondary | ICD-10-CM | POA: Diagnosis present

## 2016-01-15 DIAGNOSIS — Z880 Allergy status to penicillin: Secondary | ICD-10-CM | POA: Diagnosis not present

## 2016-01-15 DIAGNOSIS — Z9660 Presence of unspecified orthopedic joint implant: Secondary | ICD-10-CM | POA: Diagnosis not present

## 2016-01-15 DIAGNOSIS — Z853 Personal history of malignant neoplasm of breast: Secondary | ICD-10-CM | POA: Diagnosis not present

## 2016-01-15 DIAGNOSIS — Z885 Allergy status to narcotic agent status: Secondary | ICD-10-CM | POA: Insufficient documentation

## 2016-01-15 DIAGNOSIS — Z79899 Other long term (current) drug therapy: Secondary | ICD-10-CM | POA: Diagnosis not present

## 2016-01-15 DIAGNOSIS — W010XXA Fall on same level from slipping, tripping and stumbling without subsequent striking against object, initial encounter: Secondary | ICD-10-CM | POA: Insufficient documentation

## 2016-01-15 DIAGNOSIS — Z881 Allergy status to other antibiotic agents status: Secondary | ICD-10-CM | POA: Diagnosis not present

## 2016-01-15 DIAGNOSIS — Y92019 Unspecified place in single-family (private) house as the place of occurrence of the external cause: Secondary | ICD-10-CM | POA: Diagnosis not present

## 2016-01-15 DIAGNOSIS — Y939 Activity, unspecified: Secondary | ICD-10-CM | POA: Diagnosis not present

## 2016-01-15 DIAGNOSIS — I2510 Atherosclerotic heart disease of native coronary artery without angina pectoris: Secondary | ICD-10-CM | POA: Insufficient documentation

## 2016-01-15 DIAGNOSIS — Z888 Allergy status to other drugs, medicaments and biological substances status: Secondary | ICD-10-CM | POA: Diagnosis not present

## 2016-01-15 DIAGNOSIS — Z8701 Personal history of pneumonia (recurrent): Secondary | ICD-10-CM | POA: Diagnosis not present

## 2016-01-15 DIAGNOSIS — J449 Chronic obstructive pulmonary disease, unspecified: Secondary | ICD-10-CM | POA: Insufficient documentation

## 2016-01-15 DIAGNOSIS — Z90710 Acquired absence of both cervix and uterus: Secondary | ICD-10-CM | POA: Insufficient documentation

## 2016-01-15 DIAGNOSIS — Z87891 Personal history of nicotine dependence: Secondary | ICD-10-CM | POA: Diagnosis not present

## 2016-01-15 DIAGNOSIS — S40022A Contusion of left upper arm, initial encounter: Secondary | ICD-10-CM | POA: Diagnosis not present

## 2016-01-15 DIAGNOSIS — Y999 Unspecified external cause status: Secondary | ICD-10-CM | POA: Insufficient documentation

## 2016-01-15 MED ORDER — OXYCODONE HCL 5 MG PO TABS
5.0000 mg | ORAL_TABLET | Freq: Once | ORAL | Status: AC
  Administered 2016-01-15: 5 mg via ORAL

## 2016-01-15 MED ORDER — OXYCODONE HCL 5 MG PO TABS
ORAL_TABLET | ORAL | Status: AC
  Administered 2016-01-15: 5 mg via ORAL
  Filled 2016-01-15: qty 1

## 2016-01-15 NOTE — Discharge Instructions (Signed)
You evaluated for left arm and shoulder pain after fall, and your x-ray shows no sign of fracture or dislocation. We discussed he may have stretching, bruising, or ligamentous tear. I am recommending conservative treatment with rest, ice, and anti-inflammatories such as ibuprofen for the next 1-2 weeks. If your pain after 2 weeks, you may need evaluation by orthopedic doctor, or MRI tolook for ligamentous injury. Return to the emergency room for any worsening condition including worsening pain, swelling, weakness or numbness.   Contusion A contusion is a deep bruise. Contusions happen when an injury causes bleeding under the skin. Symptoms of bruising include pain, swelling, and discolored skin. The skin may turn blue, purple, or yellow. HOME CARE   Rest the injured area.  If told, put ice on the injured area.  Put ice in a plastic bag.  Place a towel between your skin and the bag.  Leave the ice on for 20 minutes, 2-3 times per day.  If told, put light pressure (compression) on the injured area using an elastic bandage. Make sure the bandage is not too tight. Remove it and put it back on as told by your doctor.  If possible, raise (elevate) the injured area above the level of your heart while you are sitting or lying down.  Take over-the-counter and prescription medicines only as told by your doctor. GET HELP IF:  Your symptoms do not get better after several days of treatment.  Your symptoms get worse.  You have trouble moving the injured area. GET HELP RIGHT AWAY IF:   You have very bad pain.  You have a loss of feeling (numbness) in a hand or foot.  Your hand or foot turns pale or cold.   This information is not intended to replace advice given to you by your health care provider. Make sure you discuss any questions you have with your health care provider.   Document Released: 04/01/2008 Document Revised: 07/05/2015 Document Reviewed: 03/01/2015 Elsevier Interactive  Patient Education [employer].

## 2016-01-15 NOTE — ED Provider Notes (Signed)
[facility] Emergency Department Provider Note   ____________________________________________  Time seen: Approximately 11:30 I have reviewed the triage vital signs and the triage nursing note.  HISTORY  Chief Complaint Fall   Historian Patient  HPI Felicia Gentry is a 70 y.o. female who lives alone had a mechanical fall and fell onto the left arm. Pain at the upper arm down through the posterior elbow. She did not hit her head. No syncope. No loss consciousness. No neck pain. Chest pain. No abdominal pain. No hip or pelvic pain.  No weakness or numbness. Pain is moderate and worse with any movement. She's had a right shoulder replacement.    Past Medical History  Diagnosis Date  . Coronary artery disease   . Dysrhythmia   . Shortness of breath     with walking  . COPD (chronic obstructive pulmonary disease) ([place])   . Pneumonia   . History of kidney stones   . Cancer ([place])     Breast  . History of blood transfusion     There are no active problems to display for this patient.   Past Surgical History  Procedure Laterality Date  . Cardiac catheterization    . Breast surgery Left 2008  . Breast reconstruction  2005  . Abdominal hysterectomy    . Cesarean section      x 2  . Bladder suspension    . Splenectomy, partial    . Tear duct probing    . Lung surgery Left 2003ish    polyps- unable to diagnosis with biospy  . Carpal tunnel release Bilateral   . Mouth surgery      repair fro m scerderma  . Rotator cuff repair Right     x3  . Shoulder surgery Right 10/04/11    reverse total  . Cervical spine surgery      x2  . Knee arthroscopy Right     x2  . Joint replacement Bilateral   . Mastectomy Left   . Anterior cervical decomp/discectomy fusion N/A 04/14/2013    Procedure: ANTERIOR CERVICAL DECOMPRESSION/DISCECTOMY FUSION 2 LEVELS;  Surgeon: Eustace Moore, MD;  Location: [place] NEURO ORS;  Service: Neurosurgery;  Laterality: N/A;   ANTERIOR CERVICAL DECOMPRESSION/DISCECTOMY FUSION CERVICAL FOUR-FIVE CERVICAL SIX-SEVEN    Current Outpatient Rx  Name  Route  Sig  Dispense  Refill  . albuterol (PROVENTIL HFA;VENTOLIN HFA) 108 (90 BASE) MCG/ACT inhaler   Inhalation   Inhale 2 puffs into the lungs every 4 (four) hours as needed for wheezing.         . Cholecalciferol 2000 UNITS TABS   Oral   Take 1 tablet by mouth daily.         . cyclobenzaprine (FLEXERIL) 5 MG tablet   Oral   Take 5 mg by mouth at bedtime.         . ferrous gluconate (FERGON) 225 (27 FE) MG tablet   Oral   Take 240 mg by mouth every morning.         Marland Kitchen FLUoxetine (PROZAC) 40 MG capsule   Oral   Take 40 mg by mouth daily.         . Fluticasone-Salmeterol (ADVAIR DISKUS) 250-50 MCG/DOSE AEPB   Inhalation   Inhale 1 puff into the lungs 2 (two) times daily as needed.         . furosemide (LASIX) 20 MG tablet   Oral   Take 20 mg by mouth 2 (two) times daily.         Marland Kitchen  gabapentin (NEURONTIN) 300 MG capsule   Oral   Take 600 mg by mouth 3 (three) times daily.         . hydroxychloroquine (PLAQUENIL) 200 MG tablet   Oral   Take 200 mg by mouth 2 (two) times daily.         Marland Kitchen letrozole (FEMARA) 2.5 MG tablet   Oral   Take 5 mg by mouth daily.         . metoCLOPramide (REGLAN) 10 MG tablet   Oral   Take 10 mg by mouth at bedtime.         Marland Kitchen NIFEdipine (PROCARDIA-XL/ADALAT-CC/NIFEDICAL-XL) 30 MG 24 hr tablet   Oral   Take 30 mg by mouth daily.         Marland Kitchen omeprazole (PRILOSEC) 40 MG capsule   Oral   Take 40 mg by mouth daily.         Marland Kitchen oxyCODONE-acetaminophen (PERCOCET/ROXICET) 5-325 MG per tablet   Oral   Take 1-2 tablets by mouth every 4 (four) hours as needed.   30 tablet   0   . rosuvastatin (CRESTOR) 20 MG tablet   Oral   Take 20 mg by mouth daily.         Marland Kitchen zolpidem (AMBIEN) 10 MG tablet   Oral   Take 10 mg by mouth at bedtime as needed for sleep.           Allergies Morphine and  related; Adhesive; Betadine; Cephalexin; Codeine; Penicillins; and Sulfa antibiotics  No family history on file.  Social History Social History  Substance Use Topics  . Smoking status: Former Smoker    Quit date: 04/14/1989  . Smokeless tobacco: None  . Alcohol Use: No    Review of Systems  Constitutional: Negative for fever. Eyes: Negative for visual changes. ENT: Negative for sore throat. Cardiovascular: Negative for chest pain. Respiratory: Negative for shortness of breath. Gastrointestinal: Negative for abdominal pain, vomiting and diarrhea. Genitourinary: Negative for dysuria. Musculoskeletal: Negative for back pain. Skin: Negative for rash. Neurological: Negative for headache. 10 point Review of Systems otherwise negative ____________________________________________   PHYSICAL EXAM:  VITAL SIGNS: ED Triage Vitals  Enc Vitals Group     BP 01/15/16 1120 128/68 mmHg     Pulse Rate 01/15/16 1120 73     Resp 01/15/16 1120 18     Temp 01/15/16 1120 98.3 F (36.8 C)     Temp Source 01/15/16 1120 Oral     SpO2 01/15/16 1120 100 %     Weight 01/15/16 1120 182 lb 15.7 oz (83 kg)     Height 01/15/16 1120 5\' 1"  (1.549 m)     Head Cir --      Peak Flow --      Pain Score 01/15/16 1124 0     Pain Loc --      Pain Edu? --      Excl. in [place]? --      Constitutional: Alert and oriented. Well appearing and in no distress. HEENT   Head: Normocephalic and atraumatic.      Eyes: Conjunctivae are normal. PERRL. Normal extraocular movements.      Ears:         Nose: No congestion/rhinnorhea.   Mouth/Throat: Mucous membranes are moist.   Neck: No stridor. Cardiovascular/Chest: Normal rate, regular rhythm.  No murmurs, rubs, or gallops. Respiratory: Normal respiratory effort without tachypnea nor retractions. Breath sounds are clear and equal bilaterally. No wheezes/rales/rhonchi. Gastrointestinal: Soft. No distention,  no guarding, no rebound. Nontender.     Genitourinary/rectal:Deferred Musculoskeletal: Pelvis stable. Hips nontender to palpation and range of motion. Right upper extremity without any traumatic injury. Left arm held in a splint across the chest. Some swelling at the mid and upper humerus area. Tender to palpation across the upper arm down to the elbow. Neurologic:  Normal speech and language. Motor exam of the left extremity is limited due to pain, however patient is able to move all her fingers, and no sensory deficits either. Skin:  Skin is warm, dry and intact. No rash noted. Psychiatric: Mood and affect are normal. Speech and behavior are normal. Patient exhibits appropriate insight and judgment.  ____________________________________________   EKG I, Lisa Roca, MD, the attending physician have personally viewed and interpreted all ECGs.  None ____________________________________________  LABS (pertinent positives/negatives)  None  ____________________________________________  RADIOLOGY All Xrays were viewed by me. Imaging interpreted by Radiologist.  Left elbow and left shoulder: No acute radiographic abnormality of left shoulder or left elbow __________________________________________  PROCEDURES  Procedure(s) performed: None  Critical Care performed: None  ____________________________________________   ED COURSE / ASSESSMENT AND PLAN  Pertinent labs & imaging results that were available during my care of the patient were reviewed by me and considered in my medical decision making (see chart for details).   Patient arrived from home after a fall. No head or neck injury. No thoracic or abdominal injury. Complained limited to left upper extremity in the area from the shoulder down to the elbow.   X-rays show no bony abnormalities such as fracture or dislocation.  Makeshift sling was removed. I've asked her to move her shoulder as much as possible in order to prevent frozen shoulder.  We discussed  conservative treatment and follow up with primary care physician and/or orthopedic physician if she has continued pain after a week to 2 weeks.  She does wear home 2 L chronically, and patient does not have portable tank here, so she will go home by nonemergency transport for oxygen access.  CONSULTATIONS:   None Patient / Family / Caregiver informed of clinical course, medical decision-making process, and agree with plan.   I discussed return precautions, follow-up instructions, and discharged instructions with patient and/or family.   ___________________________________________   FINAL CLINICAL IMPRESSION(S) / ED DIAGNOSES   Final diagnoses:  Arm contusion, left, initial encounter              Note: This dictation was prepared with Dragon dictation. Any transcriptional errors that result from this process are unintentional   Lisa Roca, MD 01/15/16 1335

## 2016-01-15 NOTE — ED Notes (Addendum)
Per EMS report, patient stumbled at home and fell. Patient c/o Left upper arm/shoulder pain today. Patient took one 5mg  Oxycodone PO at 0900 today, but denies pain relief. Patient is ambulatory per EMS with one full assist. Patient is DNR, lives at home with husband. Patient is alert and oriented. Left arm is supported with sling upon arrival to ED. Left radial pulse palpated, Good turgor, cap refill <3 seconds, left hand is warm and has good color.

## 2016-01-15 NOTE — Care Management Note (Signed)
Case Management Note  Patient Details  Name: Felicia Gentry MRN: [id number] Date of Birth: February 12, 1946  Subjective/Objective:       [name] from [facility] has confirmed that the patient does get services under the. She is seen for pulmonary hypertension.             Action/Plan:   Expected Discharge Date:                  Expected Discharge Plan:     In-House Referral:     Discharge planning Services     Post Acute Care Choice:    Choice offered to:     DME Arranged:    DME Agency:     HH Arranged:    [place] Agency:     Status of Service:     Medicare Important Message Given:    Date Medicare IM Given:    Medicare IM give by:    Date Additional Medicare IM Given:    Additional Medicare Important Message give by:     If discussed at [place] of Stay Meetings, dates discussed:    Additional Comments:  Beau Fanny, RN 01/15/2016, 11:48 AM

## 2016-01-15 NOTE — Progress Notes (Signed)
ED visit made. Patient is currently followed at home by [facility] with a hospice diagnosis of Pulmonary hypertension, she is a DNR code. Hospital care team made aware. Ms. Felicia Gentry came to the [facility] ED via EMS today for evaluation of severe left shoulder pain following a fall at home yesterday.  Patient seen lying on the Ed stretcher, wincing with movement. She and her family report that she will be discharging via EMS back home, xray's of her shoulder were negative for any fracture. Hospice team updated via phone call. Patient discharge information faxed to triage. Thank you. Flo Shanks RN, BSN, [facility] [place], [facility] [phone] c

## 2016-09-27 DEATH — deceased
